# Patient Record
Sex: Male | Born: 1960 | Marital: Married | Smoking: Former smoker
Health system: Southern US, Community
[De-identification: ages and names within clinical notes are randomized; demographics above are authoritative.]

## PROBLEM LIST (undated history)

## (undated) DIAGNOSIS — M1990 Unspecified osteoarthritis, unspecified site: Secondary | ICD-10-CM

## (undated) DIAGNOSIS — E559 Vitamin D deficiency, unspecified: Secondary | ICD-10-CM

## (undated) DIAGNOSIS — J45909 Unspecified asthma, uncomplicated: Secondary | ICD-10-CM

## (undated) DIAGNOSIS — F419 Anxiety disorder, unspecified: Secondary | ICD-10-CM

## (undated) DIAGNOSIS — S82201B Unspecified fracture of shaft of right tibia, initial encounter for open fracture type I or II: Secondary | ICD-10-CM

## (undated) DIAGNOSIS — S82401B Unspecified fracture of shaft of right fibula, initial encounter for open fracture type I or II: Secondary | ICD-10-CM

## (undated) DIAGNOSIS — E7800 Pure hypercholesterolemia, unspecified: Secondary | ICD-10-CM

## (undated) DIAGNOSIS — D649 Anemia, unspecified: Secondary | ICD-10-CM

## (undated) DIAGNOSIS — I10 Essential (primary) hypertension: Secondary | ICD-10-CM

## (undated) DIAGNOSIS — E119 Type 2 diabetes mellitus without complications: Secondary | ICD-10-CM

## (undated) HISTORY — PX: LAPAROSCOPIC APPENDECTOMY: SHX408

## (undated) HISTORY — PX: COLONOSCOPY: SHX174

## (undated) HISTORY — PX: APPENDECTOMY: SHX54

## (undated) HISTORY — PX: KNEE SURGERY: SHX244

## (undated) HISTORY — PX: KNEE ARTHROSCOPY: SUR90

---

## 2005-04-09 HISTORY — PX: ESOPHAGOGASTRODUODENOSCOPY: SHX1529

## 2014-12-10 HISTORY — PX: COLONOSCOPY: SHX174

## 2018-07-19 ENCOUNTER — Inpatient Hospital Stay (HOSPITAL_COMMUNITY)
Admission: EM | Admit: 2018-07-19 | Discharge: 2018-07-21 | DRG: 494 | Disposition: A | Discharge status: Home / Self Care | Payer: No Typology Code available for payment source | Attending: Orthopedic Surgery | Admitting: Orthopedic Surgery

## 2018-07-19 ENCOUNTER — Inpatient Hospital Stay (HOSPITAL_COMMUNITY): Payer: No Typology Code available for payment source

## 2018-07-19 ENCOUNTER — Encounter (HOSPITAL_COMMUNITY): Payer: Self-pay | Admitting: Emergency Medicine

## 2018-07-19 ENCOUNTER — Inpatient Hospital Stay (HOSPITAL_COMMUNITY): Payer: No Typology Code available for payment source | Admitting: Anesthesiology

## 2018-07-19 ENCOUNTER — Emergency Department (HOSPITAL_COMMUNITY): Payer: No Typology Code available for payment source

## 2018-07-19 ENCOUNTER — Encounter (HOSPITAL_COMMUNITY)
Admission: EM | Disposition: A | Discharge status: Home / Self Care | Payer: Self-pay | Source: Home / Self Care | Attending: Orthopedic Surgery

## 2018-07-19 DIAGNOSIS — Y9289 Other specified places as the place of occurrence of the external cause: Secondary | ICD-10-CM | POA: Diagnosis not present

## 2018-07-19 DIAGNOSIS — Y990 Civilian activity done for income or pay: Secondary | ICD-10-CM

## 2018-07-19 DIAGNOSIS — I10 Essential (primary) hypertension: Secondary | ICD-10-CM | POA: Diagnosis present

## 2018-07-19 DIAGNOSIS — M79661 Pain in right lower leg: Secondary | ICD-10-CM | POA: Diagnosis present

## 2018-07-19 DIAGNOSIS — Z79899 Other long term (current) drug therapy: Secondary | ICD-10-CM

## 2018-07-19 DIAGNOSIS — E669 Obesity, unspecified: Secondary | ICD-10-CM | POA: Diagnosis present

## 2018-07-19 DIAGNOSIS — S82401B Unspecified fracture of shaft of right fibula, initial encounter for open fracture type I or II: Secondary | ICD-10-CM

## 2018-07-19 DIAGNOSIS — S82261B Displaced segmental fracture of shaft of right tibia, initial encounter for open fracture type I or II: Principal | ICD-10-CM | POA: Diagnosis present

## 2018-07-19 DIAGNOSIS — S8991XA Unspecified injury of right lower leg, initial encounter: Secondary | ICD-10-CM

## 2018-07-19 DIAGNOSIS — E7800 Pure hypercholesterolemia, unspecified: Secondary | ICD-10-CM | POA: Diagnosis present

## 2018-07-19 DIAGNOSIS — E119 Type 2 diabetes mellitus without complications: Secondary | ICD-10-CM | POA: Diagnosis present

## 2018-07-19 DIAGNOSIS — S82202B Unspecified fracture of shaft of left tibia, initial encounter for open fracture type I or II: Secondary | ICD-10-CM

## 2018-07-19 DIAGNOSIS — Z6831 Body mass index (BMI) 31.0-31.9, adult: Secondary | ICD-10-CM | POA: Diagnosis not present

## 2018-07-19 DIAGNOSIS — S82402B Unspecified fracture of shaft of left fibula, initial encounter for open fracture type I or II: Secondary | ICD-10-CM

## 2018-07-19 DIAGNOSIS — Z7984 Long term (current) use of oral hypoglycemic drugs: Secondary | ICD-10-CM

## 2018-07-19 DIAGNOSIS — W3189XA Contact with other specified machinery, initial encounter: Secondary | ICD-10-CM

## 2018-07-19 DIAGNOSIS — S82201B Unspecified fracture of shaft of right tibia, initial encounter for open fracture type I or II: Secondary | ICD-10-CM | POA: Diagnosis present

## 2018-07-19 DIAGNOSIS — Z23 Encounter for immunization: Secondary | ICD-10-CM | POA: Diagnosis not present

## 2018-07-19 DIAGNOSIS — T1490XA Injury, unspecified, initial encounter: Secondary | ICD-10-CM

## 2018-07-19 HISTORY — DX: Essential (primary) hypertension: I10

## 2018-07-19 HISTORY — DX: Unspecified fracture of shaft of right tibia, initial encounter for open fracture type I or II: S82.201B

## 2018-07-19 HISTORY — DX: Unspecified fracture of shaft of right fibula, initial encounter for open fracture type I or II: S82.401B

## 2018-07-19 HISTORY — DX: Pure hypercholesterolemia, unspecified: E78.00

## 2018-07-19 HISTORY — DX: Unspecified fracture of shaft of right tibia, initial encounter for open fracture type I or II: S82.401B

## 2018-07-19 HISTORY — PX: TIBIA IM NAIL INSERTION: SHX2516

## 2018-07-19 HISTORY — DX: Type 2 diabetes mellitus without complications: E11.9

## 2018-07-19 LAB — CBC WITH DIFFERENTIAL/PLATELET
ABS IMMATURE GRANULOCYTES: 0 10*3/uL (ref 0.0–0.1)
BASOS ABS: 0 10*3/uL (ref 0.0–0.1)
Basophils Relative: 0 %
Eosinophils Absolute: 0 10*3/uL (ref 0.0–0.7)
Eosinophils Relative: 0 %
HCT: 38.8 % — ABNORMAL LOW (ref 39.0–52.0)
HEMOGLOBIN: 12.4 g/dL — AB (ref 13.0–17.0)
Immature Granulocytes: 0 %
LYMPHS PCT: 51 %
Lymphs Abs: 2.9 10*3/uL (ref 0.7–4.0)
MCH: 33.1 pg (ref 26.0–34.0)
MCHC: 32 g/dL (ref 30.0–36.0)
MCV: 103.5 fL — ABNORMAL HIGH (ref 78.0–100.0)
Monocytes Absolute: 0.7 10*3/uL (ref 0.1–1.0)
Monocytes Relative: 11 %
NEUTROS ABS: 2.2 10*3/uL (ref 1.7–7.7)
Neutrophils Relative %: 38 %
PLATELETS: 242 10*3/uL (ref 150–400)
RBC: 3.75 MIL/uL — AB (ref 4.22–5.81)
RDW: 14.8 % (ref 11.5–15.5)
WBC: 5.9 10*3/uL (ref 4.0–10.5)

## 2018-07-19 LAB — PROTIME-INR
INR: 0.96
PROTHROMBIN TIME: 12.7 s (ref 11.4–15.2)

## 2018-07-19 LAB — COMPREHENSIVE METABOLIC PANEL
ALBUMIN: 3.2 g/dL — AB (ref 3.5–5.0)
ALT: 24 U/L (ref 0–44)
AST: 21 U/L (ref 15–41)
Alkaline Phosphatase: 41 U/L (ref 38–126)
Anion gap: 11 (ref 5–15)
BILIRUBIN TOTAL: 0.5 mg/dL (ref 0.3–1.2)
BUN: 11 mg/dL (ref 6–20)
CO2: 25 mmol/L (ref 22–32)
Calcium: 8.8 mg/dL — ABNORMAL LOW (ref 8.9–10.3)
Chloride: 105 mmol/L (ref 98–111)
Creatinine, Ser: 0.93 mg/dL (ref 0.61–1.24)
Glucose, Bld: 111 mg/dL — ABNORMAL HIGH (ref 70–99)
POTASSIUM: 4.2 mmol/L (ref 3.5–5.1)
SODIUM: 141 mmol/L (ref 135–145)
TOTAL PROTEIN: 5.8 g/dL — AB (ref 6.5–8.1)

## 2018-07-19 LAB — GLUCOSE, CAPILLARY
Glucose-Capillary: 122 mg/dL — ABNORMAL HIGH (ref 70–99)
Glucose-Capillary: 101 mg/dL — ABNORMAL HIGH (ref 70–99)
Glucose-Capillary: 106 mg/dL — ABNORMAL HIGH (ref 70–99)

## 2018-07-19 SURGERY — INSERTION, INTRAMEDULLARY ROD, TIBIA
Anesthesia: General | Site: Leg Lower | Laterality: Right

## 2018-07-19 MED ORDER — FERROUS SULFATE 325 (65 FE) MG PO TABS
325.0000 mg | ORAL_TABLET | ORAL | Status: DC
  Administered 2018-07-20: 325 mg via ORAL
  Filled 2018-07-19 (×2): qty 1

## 2018-07-19 MED ORDER — OXYCODONE HCL 5 MG PO TABS: 5.0000 mg | ORAL_TABLET | ORAL | 0 refills | Status: DC | PRN

## 2018-07-19 MED ORDER — METHOCARBAMOL 500 MG PO TABS
500.0000 mg | ORAL_TABLET | Freq: Four times a day (QID) | ORAL | Status: DC | PRN
  Administered 2018-07-20 – 2018-07-21 (×4): 500 mg via ORAL
  Filled 2018-07-19 (×4): qty 1

## 2018-07-19 MED ORDER — HYDROMORPHONE HCL 1 MG/ML IJ SOLN
1.0000 mg | Freq: Once | INTRAMUSCULAR | Status: AC
  Administered 2018-07-19: 09:00:00 via INTRAVENOUS

## 2018-07-19 MED ORDER — BISACODYL 10 MG RE SUPP: 10.0000 mg | Freq: Every day | RECTAL | Status: DC | PRN

## 2018-07-19 MED ORDER — AMLODIPINE BESYLATE 10 MG PO TABS
10.0000 mg | ORAL_TABLET | Freq: Every day | ORAL | Status: DC
  Administered 2018-07-20 – 2018-07-21 (×2): 10 mg via ORAL
  Filled 2018-07-19 (×2): qty 1

## 2018-07-19 MED ORDER — BACLOFEN 10 MG PO TABS: 10.0000 mg | ORAL_TABLET | Freq: Three times a day (TID) | ORAL | 0 refills | Status: DC

## 2018-07-19 MED ORDER — CARVEDILOL 3.125 MG PO TABS
3.1250 mg | ORAL_TABLET | Freq: Two times a day (BID) | ORAL | Status: DC
  Administered 2018-07-20 – 2018-07-21 (×4): 3.125 mg via ORAL
  Filled 2018-07-19 (×4): qty 1

## 2018-07-19 MED ORDER — ONDANSETRON HCL 4 MG/2ML IJ SOLN: 4.0000 mg | Freq: Four times a day (QID) | INTRAMUSCULAR | Status: DC | PRN

## 2018-07-19 MED ORDER — TETANUS-DIPHTH-ACELL PERTUSSIS 5-2.5-18.5 LF-MCG/0.5 IM SUSP
0.5000 mL | Freq: Once | INTRAMUSCULAR | Status: AC
  Administered 2018-07-19: 0.5 mL via INTRAMUSCULAR

## 2018-07-19 MED ORDER — ONDANSETRON HCL 4 MG PO TABS: 4.0000 mg | ORAL_TABLET | Freq: Four times a day (QID) | ORAL | Status: DC | PRN

## 2018-07-19 MED ORDER — 0.9 % SODIUM CHLORIDE (POUR BTL) OPTIME
TOPICAL | Status: DC | PRN
  Administered 2018-07-19: 1000 mL

## 2018-07-19 MED ORDER — OXYCODONE HCL 5 MG PO TABS
5.0000 mg | ORAL_TABLET | ORAL | Status: DC | PRN
  Administered 2018-07-20 – 2018-07-21 (×7): 10 mg via ORAL
  Filled 2018-07-19 (×7): qty 2

## 2018-07-19 MED ORDER — ONDANSETRON HCL 4 MG/2ML IJ SOLN
INTRAMUSCULAR | Status: AC
  Filled 2018-07-19: qty 2

## 2018-07-19 MED ORDER — PIOGLITAZONE HCL 30 MG PO TABS
30.0000 mg | ORAL_TABLET | Freq: Every day | ORAL | Status: DC
  Administered 2018-07-20 – 2018-07-21 (×2): 30 mg via ORAL
  Filled 2018-07-19 (×2): qty 1

## 2018-07-19 MED ORDER — CEFAZOLIN SODIUM-DEXTROSE 2-4 GM/100ML-% IV SOLN
2.0000 g | INTRAVENOUS | Status: AC
  Administered 2018-07-19: 2 g via INTRAVENOUS
  Filled 2018-07-19: qty 100

## 2018-07-19 MED ORDER — SENNA 8.6 MG PO TABS
1.0000 | ORAL_TABLET | Freq: Two times a day (BID) | ORAL | Status: DC
  Administered 2018-07-20 – 2018-07-21 (×3): 8.6 mg via ORAL
  Filled 2018-07-19 (×3): qty 1

## 2018-07-19 MED ORDER — LACTATED RINGERS IV SOLN
INTRAVENOUS | Status: DC
  Administered 2018-07-19 (×2): via INTRAVENOUS

## 2018-07-19 MED ORDER — FENTANYL CITRATE (PF) 100 MCG/2ML IJ SOLN
25.0000 ug | INTRAMUSCULAR | Status: DC | PRN
  Administered 2018-07-19 (×2): 50 ug via INTRAVENOUS

## 2018-07-19 MED ORDER — MUSCLE RUB 10-15 % EX CREA
1.0000 "application " | TOPICAL_CREAM | CUTANEOUS | Status: DC | PRN
  Administered 2018-07-20: 1 via TOPICAL
  Filled 2018-07-19: qty 85

## 2018-07-19 MED ORDER — ACETAMINOPHEN 325 MG PO TABS: 650.0000 mg | ORAL_TABLET | Freq: Four times a day (QID) | ORAL | Status: DC | PRN

## 2018-07-19 MED ORDER — ONDANSETRON HCL 4 MG/2ML IJ SOLN
INTRAMUSCULAR | Status: DC | PRN
  Administered 2018-07-19: 4 mg via INTRAVENOUS

## 2018-07-19 MED ORDER — METHOCARBAMOL 500 MG PO TABS: 500.0000 mg | ORAL_TABLET | Freq: Four times a day (QID) | ORAL | Status: DC | PRN

## 2018-07-19 MED ORDER — SODIUM CHLORIDE 0.9 % IR SOLN
Status: DC | PRN
  Administered 2018-07-19: 9000 mL

## 2018-07-19 MED ORDER — METHOCARBAMOL 1000 MG/10ML IJ SOLN
500.0000 mg | Freq: Four times a day (QID) | INTRAVENOUS | Status: DC | PRN
  Filled 2018-07-19: qty 5

## 2018-07-19 MED ORDER — ENOXAPARIN SODIUM 40 MG/0.4ML ~~LOC~~ SOLN
40.0000 mg | SUBCUTANEOUS | Status: DC
  Administered 2018-07-20 – 2018-07-21 (×2): 40 mg via SUBCUTANEOUS
  Filled 2018-07-19 (×2): qty 0.4

## 2018-07-19 MED ORDER — PHENTERMINE HCL 37.5 MG PO TABS: 37.5000 mg | ORAL_TABLET | Freq: Every day | ORAL | Status: DC

## 2018-07-19 MED ORDER — ACETAMINOPHEN 650 MG RE SUPP: 650.0000 mg | Freq: Four times a day (QID) | RECTAL | Status: DC | PRN

## 2018-07-19 MED ORDER — SUGAMMADEX SODIUM 200 MG/2ML IV SOLN
INTRAVENOUS | Status: DC | PRN
  Administered 2018-07-19: 200 mg via INTRAVENOUS

## 2018-07-19 MED ORDER — POTASSIUM CHLORIDE IN NACL 20-0.45 MEQ/L-% IV SOLN
INTRAVENOUS | Status: DC
  Administered 2018-07-20: 01:00:00 via INTRAVENOUS
  Filled 2018-07-19 (×2): qty 1000

## 2018-07-19 MED ORDER — GLYCOPYRROLATE PF 0.2 MG/ML IJ SOSY
PREFILLED_SYRINGE | INTRAMUSCULAR | Status: AC
  Filled 2018-07-19: qty 1

## 2018-07-19 MED ORDER — TETANUS-DIPHTH-ACELL PERTUSSIS 5-2.5-18.5 LF-MCG/0.5 IM SUSP
INTRAMUSCULAR | Status: AC
  Administered 2018-07-19: 0.5 mL via INTRAMUSCULAR
  Filled 2018-07-19: qty 0.5

## 2018-07-19 MED ORDER — FENTANYL CITRATE (PF) 250 MCG/5ML IJ SOLN
INTRAMUSCULAR | Status: AC
  Filled 2018-07-19: qty 5

## 2018-07-19 MED ORDER — CEFAZOLIN SODIUM-DEXTROSE 2-4 GM/100ML-% IV SOLN
2.0000 g | Freq: Three times a day (TID) | INTRAVENOUS | Status: DC
  Administered 2018-07-19 – 2018-07-21 (×5): 2 g via INTRAVENOUS
  Filled 2018-07-19 (×7): qty 100

## 2018-07-19 MED ORDER — HYDROMORPHONE HCL 1 MG/ML IJ SOLN
1.0000 mg | INTRAMUSCULAR | Status: DC | PRN
  Administered 2018-07-21: 1 mg via INTRAVENOUS
  Filled 2018-07-19 (×2): qty 1

## 2018-07-19 MED ORDER — DIPHENHYDRAMINE HCL 12.5 MG/5ML PO ELIX: 12.5000 mg | ORAL_SOLUTION | ORAL | Status: DC | PRN

## 2018-07-19 MED ORDER — MIDAZOLAM HCL 2 MG/2ML IJ SOLN
INTRAMUSCULAR | Status: AC
  Filled 2018-07-19: qty 2

## 2018-07-19 MED ORDER — METFORMIN HCL 500 MG PO TABS
1000.0000 mg | ORAL_TABLET | Freq: Two times a day (BID) | ORAL | Status: DC
  Administered 2018-07-20 – 2018-07-21 (×3): 1000 mg via ORAL
  Filled 2018-07-19 (×3): qty 2

## 2018-07-19 MED ORDER — PROPOFOL 10 MG/ML IV BOLUS
INTRAVENOUS | Status: AC
  Filled 2018-07-19: qty 20

## 2018-07-19 MED ORDER — FENTANYL CITRATE (PF) 100 MCG/2ML IJ SOLN
INTRAMUSCULAR | Status: DC | PRN
  Administered 2018-07-19 (×3): 50 ug via INTRAVENOUS

## 2018-07-19 MED ORDER — MAGNESIUM CITRATE PO SOLN: 1.0000 | Freq: Once | ORAL | Status: DC | PRN

## 2018-07-19 MED ORDER — DEXAMETHASONE SODIUM PHOSPHATE 10 MG/ML IJ SOLN
INTRAMUSCULAR | Status: AC
  Filled 2018-07-19: qty 1

## 2018-07-19 MED ORDER — ACETAMINOPHEN 500 MG PO TABS
1000.0000 mg | ORAL_TABLET | Freq: Four times a day (QID) | ORAL | Status: DC
  Administered 2018-07-20 – 2018-07-21 (×5): 1000 mg via ORAL
  Filled 2018-07-19 (×6): qty 2

## 2018-07-19 MED ORDER — ROCURONIUM BROMIDE 10 MG/ML (PF) SYRINGE
PREFILLED_SYRINGE | INTRAVENOUS | Status: DC | PRN
  Administered 2018-07-19: 50 mg via INTRAVENOUS

## 2018-07-19 MED ORDER — SIMVASTATIN 40 MG PO TABS: 40.0000 mg | ORAL_TABLET | Freq: Every evening | ORAL | Status: DC

## 2018-07-19 MED ORDER — HYDROMORPHONE HCL 1 MG/ML IJ SOLN
INTRAMUSCULAR | Status: AC
  Filled 2018-07-19: qty 1

## 2018-07-19 MED ORDER — ENOXAPARIN SODIUM 40 MG/0.4ML ~~LOC~~ SOLN: 40.0000 mg | SUBCUTANEOUS | Status: DC

## 2018-07-19 MED ORDER — DOCUSATE SODIUM 100 MG PO CAPS
100.0000 mg | ORAL_CAPSULE | Freq: Two times a day (BID) | ORAL | Status: DC
  Administered 2018-07-19 – 2018-07-21 (×4): 100 mg via ORAL
  Filled 2018-07-19 (×3): qty 1

## 2018-07-19 MED ORDER — OXYCODONE HCL 5 MG PO TABS
5.0000 mg | ORAL_TABLET | ORAL | Status: DC | PRN
  Administered 2018-07-19: 15 mg via ORAL
  Filled 2018-07-19: qty 3

## 2018-07-19 MED ORDER — ONDANSETRON HCL 4 MG PO TABS: 4.0000 mg | ORAL_TABLET | Freq: Three times a day (TID) | ORAL | 0 refills | Status: DC | PRN

## 2018-07-19 MED ORDER — EPHEDRINE 5 MG/ML INJ
INTRAVENOUS | Status: AC
  Filled 2018-07-19: qty 10

## 2018-07-19 MED ORDER — MIDAZOLAM HCL 2 MG/2ML IJ SOLN
INTRAMUSCULAR | Status: DC | PRN
  Administered 2018-07-19: 2 mg via INTRAVENOUS

## 2018-07-19 MED ORDER — ONDANSETRON HCL 4 MG/2ML IJ SOLN: 4.0000 mg | Freq: Once | INTRAMUSCULAR | Status: DC | PRN

## 2018-07-19 MED ORDER — DEXAMETHASONE SODIUM PHOSPHATE 10 MG/ML IJ SOLN
INTRAMUSCULAR | Status: DC | PRN
  Administered 2018-07-19: 10 mg via INTRAVENOUS

## 2018-07-19 MED ORDER — CANAGLIFLOZIN 100 MG PO TABS
100.0000 mg | ORAL_TABLET | Freq: Every day | ORAL | Status: DC
  Administered 2018-07-20 – 2018-07-21 (×2): 100 mg via ORAL
  Filled 2018-07-19 (×2): qty 1

## 2018-07-19 MED ORDER — HYDROMORPHONE HCL 1 MG/ML IJ SOLN: 1.0000 mg | INTRAMUSCULAR | Status: DC | PRN

## 2018-07-19 MED ORDER — CITALOPRAM HYDROBROMIDE 40 MG PO TABS
40.0000 mg | ORAL_TABLET | Freq: Every day | ORAL | Status: DC
  Administered 2018-07-20 – 2018-07-21 (×2): 40 mg via ORAL
  Filled 2018-07-19 (×2): qty 1

## 2018-07-19 MED ORDER — POLYETHYLENE GLYCOL 3350 17 G PO PACK: 17.0000 g | PACK | Freq: Every day | ORAL | Status: DC | PRN

## 2018-07-19 MED ORDER — SENNA-DOCUSATE SODIUM 8.6-50 MG PO TABS: 2.0000 | ORAL_TABLET | Freq: Every day | ORAL | 1 refills | Status: DC

## 2018-07-19 MED ORDER — DOXYCYCLINE HYCLATE 50 MG PO CAPS: 50.0000 mg | ORAL_CAPSULE | Freq: Two times a day (BID) | ORAL | 0 refills | Status: DC

## 2018-07-19 MED ORDER — LIDOCAINE 2% (20 MG/ML) 5 ML SYRINGE
INTRAMUSCULAR | Status: DC | PRN
  Administered 2018-07-19: 40 mg via INTRAVENOUS

## 2018-07-19 MED ORDER — CEFAZOLIN SODIUM-DEXTROSE 1-4 GM/50ML-% IV SOLN: 1.0000 g | Freq: Once | INTRAVENOUS | Status: DC

## 2018-07-19 MED ORDER — CEFAZOLIN SODIUM-DEXTROSE 2-4 GM/100ML-% IV SOLN
INTRAVENOUS | Status: AC
  Administered 2018-07-19: 1 g
  Filled 2018-07-19: qty 100

## 2018-07-19 MED ORDER — PROPOFOL 10 MG/ML IV BOLUS
INTRAVENOUS | Status: DC | PRN
  Administered 2018-07-19: 200 mg via INTRAVENOUS

## 2018-07-19 MED ORDER — FENTANYL CITRATE (PF) 100 MCG/2ML IJ SOLN
INTRAMUSCULAR | Status: AC
  Filled 2018-07-19: qty 2

## 2018-07-19 MED ORDER — ZOLPIDEM TARTRATE 5 MG PO TABS
5.0000 mg | ORAL_TABLET | Freq: Every evening | ORAL | Status: DC | PRN
  Filled 2018-07-19: qty 1

## 2018-07-19 MED ORDER — ROCURONIUM BROMIDE 50 MG/5ML IV SOSY
PREFILLED_SYRINGE | INTRAVENOUS | Status: AC
  Filled 2018-07-19: qty 5

## 2018-07-19 MED ORDER — LIDOCAINE 2% (20 MG/ML) 5 ML SYRINGE
INTRAMUSCULAR | Status: AC
  Filled 2018-07-19: qty 5

## 2018-07-19 MED ORDER — DICLOFENAC SODIUM 25 MG PO TBEC
25.0000 mg | DELAYED_RELEASE_TABLET | Freq: Four times a day (QID) | ORAL | Status: DC
  Administered 2018-07-19 – 2018-07-21 (×7): 25 mg via ORAL
  Filled 2018-07-19 (×10): qty 1

## 2018-07-19 SURGICAL SUPPLY — 78 items
BANDAGE ACE 4X5 VEL STRL LF (GAUZE/BANDAGES/DRESSINGS) ×6 IMPLANT
BANDAGE ACE 6X5 VEL STRL LF (GAUZE/BANDAGES/DRESSINGS) ×3 IMPLANT
BANDAGE ESMARK 6X9 LF (GAUZE/BANDAGES/DRESSINGS) IMPLANT
BIT DRILL 2.5X2.75 QC CALB (BIT) ×3 IMPLANT
BIT DRILL CALIBRATED 4.3X320MM (BIT) ×1 IMPLANT
BIT DRILL CROWE POINT TWST 4.3 (DRILL) ×1 IMPLANT
BLADE CLIPPER SURG (BLADE) IMPLANT
BLADE SURG 15 STRL LF DISP TIS (BLADE) ×1 IMPLANT
BLADE SURG 15 STRL SS (BLADE) ×2
BNDG COHESIVE 6X5 TAN STRL LF (GAUZE/BANDAGES/DRESSINGS) ×3 IMPLANT
BNDG ESMARK 6X9 LF (GAUZE/BANDAGES/DRESSINGS)
BNDG GAUZE ELAST 4 BULKY (GAUZE/BANDAGES/DRESSINGS) ×3 IMPLANT
BOOTCOVER CLEANROOM LRG (PROTECTIVE WEAR) ×6 IMPLANT
CLOSURE STERI-STRIP 1/2X4 (GAUZE/BANDAGES/DRESSINGS) ×2
CLSR STERI-STRIP ANTIMIC 1/2X4 (GAUZE/BANDAGES/DRESSINGS) ×4 IMPLANT
COVER SURGICAL LIGHT HANDLE (MISCELLANEOUS) ×6 IMPLANT
CUFF TOURNIQUET SINGLE 34IN LL (TOURNIQUET CUFF) IMPLANT
CUFF TOURNIQUET SINGLE 44IN (TOURNIQUET CUFF) IMPLANT
DRAPE C-ARM 42X72 X-RAY (DRAPES) ×3 IMPLANT
DRAPE C-ARMOR (DRAPES) ×3 IMPLANT
DRAPE HALF SHEET 40X57 (DRAPES) ×6 IMPLANT
DRAPE IMP U-DRAPE 54X76 (DRAPES) ×3 IMPLANT
DRAPE ORTHO SPLIT 77X108 STRL (DRAPES) ×6
DRAPE SURG ORHT 6 SPLT 77X108 (DRAPES) ×3 IMPLANT
DRAPE U-SHAPE 47X51 STRL (DRAPES) ×3 IMPLANT
DRILL CALIBRATED 4.3X320MM (BIT) ×3
DRILL CROWE POINT TWIST 4.3 (DRILL) ×3
DRSG ADAPTIC 3X8 NADH LF (GAUZE/BANDAGES/DRESSINGS) ×3 IMPLANT
DRSG MEPILEX BORDER 4X4 (GAUZE/BANDAGES/DRESSINGS) ×9 IMPLANT
DRSG MEPILEX BORDER 4X8 (GAUZE/BANDAGES/DRESSINGS) ×3 IMPLANT
DURAPREP 26ML APPLICATOR (WOUND CARE) ×3 IMPLANT
ELECT REM PT RETURN 9FT ADLT (ELECTROSURGICAL) ×3
ELECTRODE REM PT RTRN 9FT ADLT (ELECTROSURGICAL) ×1 IMPLANT
FACESHIELD WRAPAROUND (MASK) IMPLANT
GAUZE SPONGE 4X4 12PLY STRL (GAUZE/BANDAGES/DRESSINGS) ×6 IMPLANT
GLOVE BIOGEL PI ORTHO PRO SZ8 (GLOVE) ×2
GLOVE ORTHO TXT STRL SZ7.5 (GLOVE) ×6 IMPLANT
GLOVE PI ORTHO PRO STRL SZ8 (GLOVE) ×1 IMPLANT
GLOVE SURG ORTHO 8.0 STRL STRW (GLOVE) ×6 IMPLANT
GOWN STRL REUS W/ TWL LRG LVL3 (GOWN DISPOSABLE) IMPLANT
GOWN STRL REUS W/TWL LRG LVL3 (GOWN DISPOSABLE)
GUIDE PIN 3.2MM (PIN) ×3 IMPLANT
GUIDEPIN 3.2X17.5 THRD DISP (PIN) ×3 IMPLANT
GUIDEWIRE 2.6X80 BEAD TIP (WIRE) ×1 IMPLANT
GUIDWIRE 2.6X80 BEAD TIP (WIRE) ×3
KIT BASIN OR (CUSTOM PROCEDURE TRAY) ×3 IMPLANT
KIT TURNOVER KIT B (KITS) ×3 IMPLANT
MANIFOLD NEPTUNE II (INSTRUMENTS) ×3 IMPLANT
NAIL TIBIAL PHOENIX 9.0X370MM (Nail) ×3 IMPLANT
NS IRRIG 1000ML POUR BTL (IV SOLUTION) ×3 IMPLANT
PACK GENERAL/GYN (CUSTOM PROCEDURE TRAY) ×3 IMPLANT
PACK UNIVERSAL I (CUSTOM PROCEDURE TRAY) ×3 IMPLANT
PAD ARMBOARD 7.5X6 YLW CONV (MISCELLANEOUS) ×6 IMPLANT
PAD CAST 4YDX4 CTTN HI CHSV (CAST SUPPLIES) ×2 IMPLANT
PADDING CAST COTTON 4X4 STRL (CAST SUPPLIES) ×4
PLATE TUB 100DEG 5 HO (Plate) ×3 IMPLANT
SCREW CORT TI DBL LEAD 5X34 (Screw) ×3 IMPLANT
SCREW CORT TI DBL LEAD 5X40 (Screw) ×3 IMPLANT
SCREW CORT TI DBL LEAD 5X60 (Screw) ×3 IMPLANT
SCREW CORT TI DBL LEAD 5X65 (Screw) ×3 IMPLANT
SCREW CORTICAL 3.5MM  10MM (Screw) ×8 IMPLANT
SCREW CORTICAL 3.5MM 10MM (Screw) ×4 IMPLANT
SET CYSTO W/LG BORE CLAMP LF (SET/KITS/TRAYS/PACK) ×3 IMPLANT
SPLINT PLASTER CAST XFAST 5X30 (CAST SUPPLIES) ×1 IMPLANT
SPLINT PLASTER XFAST SET 5X30 (CAST SUPPLIES) ×2
STAPLER VISISTAT 35W (STAPLE) ×3 IMPLANT
STOCKINETTE IMPERVIOUS LG (DRAPES) ×6 IMPLANT
SUCTION FRAZIER HANDLE 10FR (MISCELLANEOUS) ×4
SUCTION TUBE FRAZIER 10FR DISP (MISCELLANEOUS) ×2 IMPLANT
SUT VIC AB 0 CT1 18XCR BRD 8 (SUTURE) ×1 IMPLANT
SUT VIC AB 0 CT1 8-18 (SUTURE) ×2
SUT VIC AB 2-0 CT1 27 (SUTURE) ×2
SUT VIC AB 2-0 CT1 TAPERPNT 27 (SUTURE) ×1 IMPLANT
SUT VIC AB 3-0 SH 8-18 (SUTURE) ×3 IMPLANT
TOWEL OR 17X24 6PK STRL BLUE (TOWEL DISPOSABLE) ×3 IMPLANT
TOWEL OR 17X26 10 PK STRL BLUE (TOWEL DISPOSABLE) ×3 IMPLANT
TRAY FOLEY MTR SLVR 16FR STAT (SET/KITS/TRAYS/PACK) IMPLANT
WATER STERILE IRR 1000ML POUR (IV SOLUTION) ×3 IMPLANT

## 2018-07-19 NOTE — ED Notes (Signed)
[facility]

## 2018-07-19 NOTE — ED Notes (Signed)
5N unable to take reporting. Going up with pt to do bedside report.

## 2018-07-19 NOTE — H&P (Addendum)
Duane Collins is an 57 y.o. male.   Chief Complaint: Right tib/fib fx  HPI: Duane Collins was at work and got run over by a forklift. He's unsure exactly how it happened. He had significant leg pain and came to the ED for evaluation. X-rays showed a right tib/fib fx and orthopedic surgery was consulted.  Pain better with rest, as well as IV pain medication, worse with movement.  He denies any other injuries.  Injury occurred earlier today.  PMHX: DM, HTN  No family history on file. Social History: Denies nicotine  Allergies: Allergies not on file   Review of Systems  Constitutional: Negative for weight loss.  HENT: Negative for ear discharge, ear pain, hearing loss and tinnitus.   Eyes: Negative for blurred vision, double vision, photophobia and pain.  Respiratory: Negative for cough, sputum production and shortness of breath.   Cardiovascular: Negative for chest pain.  Gastrointestinal: Negative for abdominal pain, nausea and vomiting.  Genitourinary: Negative for dysuria, flank pain, frequency and urgency.  Musculoskeletal: Positive for joint pain (Right lower leg). Negative for back pain, falls, myalgias and neck pain.  Neurological: Negative for dizziness, tingling, sensory change, focal weakness, loss of consciousness and headaches.  Endo/Heme/Allergies: Does not bruise/bleed easily.  Psychiatric/Behavioral: Negative for depression, memory loss and substance abuse. The patient is not nervous/anxious.     There were no vitals taken for this visit. Physical Exam  Constitutional: He appears well-developed and well-nourished. No distress.  HENT:  Head: Normocephalic and atraumatic.  Eyes: Conjunctivae are normal. Right eye exhibits no discharge. Left eye exhibits no discharge. No scleral icterus.  Neck: Normal range of motion.  Cardiovascular: Normal rate and regular rhythm.  Respiratory: Effort normal. No respiratory distress.  Musculoskeletal:  RLE Punctate wound medial lower leg,  no ecchymosis or rash  TTP, compartments soft  No knee or ankle effusion  Sens DPN, SPN, TN intact  Motor EHL, ext, flex, evers 5/5  DP 2+, PT 1+, No significant edema  Neurological: He is alert.  Skin: Skin is warm and dry. He is not diaphoretic.  Psychiatric: He has a normal mood and affect. His behavior is normal.     Assessment/Plan Forklift accident Right tib/fib fxs -- Plan IMN, date and surgeon TBD. NPO for now. DM HTN    Freeman CaldronMichael J. Jeffery, PA-C Orthopedic Surgery [phone] 07/19/2018, 9:18 AM   Patient seen and agree with above.  He has been given IV Ancef, concern for grade 1 open fracture.  We have been awaiting availability for operative room services, and are now able to proceed with urgent irrigation and debridement, intramedullary nail fixation, and I will also likely use a small plate for fixation on the proximal third tibia.  This is a complicated segmental fracture, that has risk for malunion, nonunion, infection, osteomyelitis, among others.  The risks benefits and alternatives were discussed with the patient including but not limited to the risks of nonoperative treatment, versus surgical intervention including infection, bleeding, nerve injury, malunion, nonunion, the need for revision surgery, hardware prominence, hardware failure, the need for hardware removal, blood clots, cardiopulmonary complications, morbidity, mortality, among others, and they were willing to proceed.    5:05 PM

## 2018-07-19 NOTE — Op Note (Signed)
07/19/2018  8:14 PM  PATIENT:  Duane Collins    PRE-OPERATIVE DIAGNOSIS: Grade 1 open right segmental tibia fracture with a proximal third metaphyseal fracture and a distal third diaphyseal fracture  POST-OPERATIVE DIAGNOSIS:  Same  PROCEDURE:    1.  Incision, irrigation, debridement, open grade 1 right distal tibial diaphyseal fracture 2.  Open plating, right proximal third tibia fracture 3.  Intramedullary nail fixation, right proximal third and distal third tibia fracture  SURGEON:  Eulas Post, MD  PHYSICIAN ASSISTANT: Janace Litten, OPA-C, present and scrubbed throughout the case, critical for completion in a timely fashion, and for retraction, instrumentation, and closure.  ANESTHESIA:   General  PREOPERATIVE INDICATIONS:  Duane Collins is a  57 y.o. male who was run over by a forklift today and had an open right tibia fracture that was segmental in nature.  His compartments were soft and neurovascularly intact distally.  He elected for urgent surgical management.  The risks benefits and alternatives were discussed with the patient including but not limited to the risks of nonoperative treatment, versus surgical intervention including infection, bleeding, nerve injury, malunion, nonunion, the need for revision surgery, hardware prominence, hardware failure, the need for hardware removal, blood clots, cardiopulmonary complications, morbidity, mortality, among others, and they were willing to proceed.    ESTIMATED BLOOD LOSS:  OPERATIVE IMPLANTS: Biomet [place] tibial nail size 9 with a total of 2 proximal interlocking bolts, and 2 distal interlocking bolts.  The locking sleeve was engaged proximally, to achieve a fixed angle device, and I also used a 5 hole one third tubular plate with 2 proximal and 2 distal screws for fixation of the proximal fracture to allow appropriate positioning and instrumentation of the nail.  OPERATIVE FINDINGS: Comminuted proximal and  distal fracture.  OPERATIVE PROCEDURE: The patient was brought to the operating room and placed in the supine position. Gen. anesthesia was administered. The lower extremity was prepped and draped in usual sterile fashion. Time out was performed.  I localized the open fracture, it was basically a pinhole, but there was significant soft tissue destruction below.  I extended the pinhole about 1.5 cm on either side, which allowed me to deliver the fragment of bone through the wound using a bone hook, as well as a Cobb.  This was more of an incisional debridement, I did not really excise any tissue although there was a small fragment of bone which I did excise.  This was avascular.  I irrigated a total of 9 L across the wound.  A scalpel, dissection scissors, and Cobb was utilized for the debridement.  After complete debridement had been carried out, I changed gloves, placed a new drape, passed off the instruments and suction, and proceeded with the remainder of the case.  I localized the distal medial aspect of the proximal tibial fracture using a C arm, and then made a longitudinal incision large enough to accommodate placement of a 5 hole plate.  There was some comminution in this proximal segment, as well as a butterfly piece, which I was able to basically work around.  I achieved as close to anatomic fixation as possible, and then secured the plate proximally and distally with size 10 unicortical screws.  Once I had fixation proximally, I proceeded with the tibial nail portion of the case.  Anterior patellar tendon incision was performed and the proximal tibia exposed. The knee was hyperflexed, and a guidewire introduced into the appropriate position and confirmed on fluoroscopy  on both AP and lateral views.  I opened the proximal tibia with the appropriate reamer, and then placed a ball-tipped guidewire down across the fracture site reducing it anatomically.  I confirmed position on AP and lateral  views across the entire length of the tibia, and then reamed sequentially to 1.5 mm above the nail size.  I had significant chatter at the size 10, I am not sure if I was engaging on any of the unicortical screws, or if it was the cortex, but nonetheless I selected to use a size 9 nail given the significant interference.  The nail was measured in length, selected, opened, assembled, and then delivered down the tibia across the fracture site. Appropriate alignment confirmed on AP and lateral views. The length was also confirmed.  I then secured the nail with proximal interlocking bolts, and also used perfect circles technique to secure the nail distally with interlocking bolts. I took care to confirm fracture apposition as well as rotational alignment clinically and radiographically prior to securing the distal segment.  The proximal most screw from the distal cluster engaged somewhat on the nail, but I was able to pass it, but if it ever needs to be removed, caution should be made to not strip the screw.  I did not have any missed turns, but it does have an interference fit with the nail.  Final C-arm pictures were taken, the wounds were irrigated copiously, there were also injected with Marcaine, and the patellar tendon split repaired with Vicryl followed by Vicryl and Monocryl for the subcutaneous tissues. Steri-Strips and sterile gauze was applied followed by a posterior splint. The patient was awakened and returned to the PACU in stable and satisfactory condition. There were no complications.

## 2018-07-19 NOTE — ED Triage Notes (Signed)
BIB EMS from work, pt was involved in forklift accident. Forklift ran over pt R leg resulting in deformity. No other complaints. VSS. Given Fentanyl en route.

## 2018-07-19 NOTE — Care Management Note (Signed)
Case Management Note  Patient Details  Name: Duane Collins MRN: [id number] Date of Birth: [birth year]  Subjective/Objective:  Closed Fracture of right tibia                 Action/Plan: Pt lives at home with spouse; Injured at work, Workers Comp; CM talked to patient, he does not have all of his contact information with him at this time. CM to follow up.   Expected Discharge Date:   possibly 07/23/2018               Expected Discharge Plan:   Home  Status of Service:   In progress  Reola MosherChandler, Vonya Ohalloran L, RN,MHA,BSN  [phone] 07/19/2018, 12:38 PM

## 2018-07-19 NOTE — Transfer of Care (Signed)
Immediate Anesthesia Transfer of Care Note  Patient: Duane PiliDennis R Tapia  Procedure(s) Performed: INTRAMEDULLARY (IM) NAIL TIBIAL (Right Leg Lower)  Patient Location: PACU  Anesthesia Type:General  Level of Consciousness: awake, oriented and sedated  Airway & Oxygen Therapy: Patient connected to nasal cannula oxygen  Post-op Assessment: Report given to RN and Post -op Vital signs reviewed and stable  Post vital signs: Reviewed and stable  Last Vitals:  Vitals Value Taken Time  BP 138/70 07/19/2018  8:54 PM  Temp    Pulse 67 07/19/2018  8:56 PM  Resp 16 07/19/2018  8:56 PM  SpO2 100 % 07/19/2018  8:56 PM  Vitals shown include unvalidated device data.  Last Pain:  Vitals:   07/19/18 1231  TempSrc:   PainSc: Asleep         Complications: No apparent anesthesia complications

## 2018-07-19 NOTE — Discharge Instructions (Signed)

## 2018-07-19 NOTE — ED Provider Notes (Signed)
[facility] EMERGENCY DEPARTMENT Provider Note   CSN: [id number] Arrival date & time: 07/19/18  0855     History   Chief Complaint Chief Complaint  Patient presents with  . Leg Injury    HPI Duane Collins is a 57 y.o. male.  HPI  57 year old male with a history of hypertension, diabetes and borderline cholesterol presents with right leg injury.  Patient was run over by a forklift on his right leg.  He did not see the injury but immediately felt pain.  Been having severe right leg pain since.  EMS reports closed leg injury and has been given 300 mcg IV fentanyl.  No weakness or numbness.  No other injuries including no headache, chest pain, abdominal pain. No pain in knee or ankle.  Past Medical History:  Diagnosis Date  . Diabetes mellitus without complication (HCC)   . High cholesterol   . Hypertension     Patient Active Problem List   Diagnosis Date Noted  . Closed fracture of right tibia 07/19/2018    History reviewed. No pertinent surgical history.      Home Medications    Prior to Admission medications   Medication Sig Start Date End Date Taking? Authorizing Provider  amLODipine (NORVASC) 10 MG tablet Take 10 mg by mouth daily. 04/29/18  Yes [provider]  Camphor-Menthol-Methyl Sal (MUSCLE RUB ULTRA STRENGTH) 02-09-29 % CREA Apply 1 application topically as needed (muscle cramps).   Yes [provider]  carvedilol (COREG) 3.125 MG tablet Take 3.125 mg by mouth 2 (two) times daily. 05/26/18  Yes [provider]  citalopram (CELEXA) 40 MG tablet Take 40 mg by mouth daily.   Yes [provider]  dapagliflozin propanediol (FARXIGA) 10 MG TABS tablet Take 10 mg by mouth daily.   Yes [provider]  ferrous sulfate 325 (65 FE) MG tablet Take 325 mg by mouth once a week. 05/26/18  Yes [provider]  metFORMIN (GLUCOPHAGE) 1000 MG tablet Take 1,000 mg by mouth 2 (two) times daily with a meal.  05/12/18  Yes [provider]  phentermine (ADIPEX-P) 37.5 MG tablet Take 37.5 mg by mouth daily. 05/14/18  Yes [provider]  pioglitazone (ACTOS) 30 MG tablet Take 30 mg by mouth daily. 05/12/18  Yes [provider]  simvastatin (ZOCOR) 40 MG tablet Take 40 mg by mouth every evening. 04/21/18  Yes [provider]  Vitamin D, Ergocalciferol, (DRISDOL) 50000 units CAPS capsule Take 50,000 Units by mouth once a week. 05/09/18  Yes [provider]  ZIPSOR 25 MG CAPS Take 25 mg by mouth 4 (four) times daily. 06/08/18  Yes [provider]    Family History No family history on file.  Social History Social History   Tobacco Use  . Smoking status: Never Smoker  . Smokeless tobacco: Never Used  Substance Use Topics  . Alcohol use: Not Currently  . Drug use: Not Currently     Allergies   Patient has no known allergies.   Review of Systems Review of Systems  Respiratory: Negative for shortness of breath.   Cardiovascular: Negative for chest pain.  Gastrointestinal: Negative for abdominal pain.  Musculoskeletal: Positive for arthralgias.  Neurological: Negative for weakness and numbness.  All other systems reviewed and are negative.    Physical Exam Updated Vital Signs BP 129/75 (BP Location: Left Arm)   Pulse (!) 58   Temp 98.2 F (36.8 C) (Oral)   Resp 20  Ht 5\' 8"  (1.727 m)   Wt 95.3 kg   SpO2 97%   BMI 31.93 kg/m   Physical Exam  Constitutional: He is oriented to person, place, and time. He appears well-developed and well-nourished. He appears distressed (in severe pain).  obese  HENT:  Head: Normocephalic and atraumatic.  Right Ear: External ear normal.  Left Ear: External ear normal.  Nose: Nose normal.  Eyes: Right eye exhibits no discharge. Left eye exhibits no discharge.  Neck: Neck supple.  Cardiovascular: Normal rate, regular rhythm and normal heart sounds.  Pulses:      Dorsalis pedis pulses are 2+ on  the right side, and 2+ on the left side.  Pulmonary/Chest: Effort normal and breath sounds normal.  Abdominal: Soft. There is no tenderness.  Musculoskeletal: He exhibits no edema.       Right knee: He exhibits no swelling. No tenderness found.       Right ankle: He exhibits no swelling. No tenderness.       Right lower leg: He exhibits tenderness, deformity and laceration.       Legs:      Right foot: There is no tenderness and no swelling.  Puncture wound to medial mid lower leg. Oozing blood Normal sensation and movement of foot  Neurological: He is alert and oriented to person, place, and time.  Skin: Skin is warm and dry. He is not diaphoretic.  Nursing note and vitals reviewed.    ED Treatments / Results  Labs (all labs ordered are listed, but only abnormal results are displayed) Labs Reviewed  CBC WITH DIFFERENTIAL/PLATELET - Abnormal; Notable for the following components:      Result Value   RBC 3.75 (*)    Hemoglobin 12.4 (*)    HCT 38.8 (*)    MCV 103.5 (*)    All other components within normal limits  COMPREHENSIVE METABOLIC PANEL - Abnormal; Notable for the following components:   Glucose, Bld 111 (*)    Calcium 8.8 (*)    Total Protein 5.8 (*)    Albumin 3.2 (*)    All other components within normal limits  PROTIME-INR    EKG None  Radiology Dg Chest Portable 1 View  Result Date: 07/19/2018 CLINICAL DATA:  Preoperative examination prior to ORIF for right tibia and fibular fracture. Former smoker. No current chest complaints. EXAM: PORTABLE CHEST 1 VIEW COMPARISON:  Chest x-ray of May 25, 2018 FINDINGS: The lungs are adequately inflated and clear. The cardiac silhouette remains enlarged. The central pulmonary vascularity is mildly prominent but stable. The mediastinum is normal in width. There is no pleural effusion. The bony thorax exhibits no acute abnormality. IMPRESSION: Stable enlargement of the cardiac silhouette. No pulmonary vascular congestion,  pulmonary edema, nor pneumonia. Electronically Signed   By: David  SwazilandJordan M.D.   On: 07/19/2018 09:27   Dg Tibia/fibula Right Port  Result Date: 07/19/2018 CLINICAL DATA:  Run over by a forklift. EXAM: PORTABLE RIGHT TIBIA AND FIBULA - 2 VIEW COMPARISON:  None. FINDINGS: Comminuted fractures noted through the proximal shaft and mid to distal shaft of the left tibia with mild displacement. Mildly displaced mid to distal fibular shaft fracture also noted. Soft tissues appear intact. No subluxation or dislocation. IMPRESSION: Two separate levels of comminuted, slightly displaced fractures in the proximal tibia shaft and mid to distal tibia shaft. Mid to distal fibular shaft fracture. Electronically Signed   By: Charlett NoseKevin  Dover M.D.   On: 07/19/2018 09:27    Procedures Procedures (  including critical care time)  Medications Ordered in ED Medications  ceFAZolin (ANCEF) IVPB 1 g/50 mL premix (has no administration in time range)  HYDROmorphone (DILAUDID) injection 1 mg ( Intravenous Given 07/19/18 0915)  Tdap (BOOSTRIX) injection 0.5 mL (0.5 mLs Intramuscular Given 07/19/18 0915)  ceFAZolin (ANCEF) 2-4 GM/100ML-% IVPB (1 g  New Bag/Given 07/19/18 0915)     Initial Impression / Assessment and Plan / ED Course  I have reviewed the triage vital signs and the nursing notes.  Pertinent labs & imaging results that were available during my care of the patient were reviewed by me and considered in my medical decision making (see chart for details).     Patient is neurovascular intact and his only injury appears to be his right leg.  Given the puncture wound with continuous oozing, I am concerned for open fracture given the location compared to his fracture pattern.  He does not have firm compartments or initial concern for compartment syndrome.  He appears medically stable at this time.  Orthopedics will admit. Given IV dilaudid for pain.  Final Clinical Impressions(s) / ED Diagnoses   Final diagnoses:    Right leg injury, initial encounter  Type I or II open fracture of left tibia and fibula, initial encounter    ED Discharge Orders    None       Pricilla Loveless, MD 07/19/18 1001

## 2018-07-19 NOTE — Anesthesia Preprocedure Evaluation (Signed)
Anesthesia Evaluation  Patient identified by MRN, date of birth, ID band Patient awake    Reviewed: Allergy & Precautions, NPO status , Patient's Chart, lab work & pertinent test results  Airway Mallampati: II  TM Distance: >3 FB Neck ROM: Full    Dental  (+) Dental Advisory Given   Pulmonary neg pulmonary ROS,    breath sounds clear to auscultation       Cardiovascular hypertension, Pt. on medications  Rhythm:Regular Rate:Normal     Neuro/Psych negative neurological ROS     GI/Hepatic negative GI ROS, Neg liver ROS,   Endo/Other  diabetes, Type 2  Renal/GU negative Renal ROS     Musculoskeletal   Abdominal   Peds  Hematology negative hematology ROS (+)   Anesthesia Other Findings   Reproductive/Obstetrics                             Lab Results  Component Value Date   WBC 5.9 07/19/2018   HGB 12.4 (L) 07/19/2018   HCT 38.8 (L) 07/19/2018   MCV 103.5 (H) 07/19/2018   PLT 242 07/19/2018   Lab Results  Component Value Date   CREATININE 0.93 07/19/2018   BUN 11 07/19/2018   NA 141 07/19/2018   K 4.2 07/19/2018   CL 105 07/19/2018   CO2 25 07/19/2018    Anesthesia Physical Anesthesia Plan  ASA: II  Anesthesia Plan: General   Post-op Pain Management:    Induction: Intravenous  PONV Risk Score and Plan: 2 and Ondansetron, Dexamethasone and Treatment may vary due to age or medical condition  Airway Management Planned: Oral ETT  Additional Equipment:   Intra-op Plan:   Post-operative Plan: Extubation in OR  Informed Consent: I have reviewed the patients History and Physical, chart, labs and discussed the procedure including the risks, benefits and alternatives for the proposed anesthesia with the patient or authorized representative who has indicated his/her understanding and acceptance.   Dental advisory given  Plan Discussed with: CRNA  Anesthesia Plan Comments:          Anesthesia Quick Evaluation

## 2018-07-19 NOTE — Anesthesia Procedure Notes (Signed)
Procedure Name: Intubation Date/Time: 07/19/2018 5:34 PM Performed by: Barrington Ellison, CRNA Pre-anesthesia Checklist: Patient identified, Emergency Drugs available, Suction available and Patient being monitored Patient Re-evaluated:Patient Re-evaluated prior to induction Oxygen Delivery Method: Circle System Utilized Preoxygenation: Pre-oxygenation with 100% oxygen Induction Type: IV induction Ventilation: Mask ventilation without difficulty Laryngoscope Size: Mac and 4 Grade View: Grade I Tube type: Oral Tube size: 7.5 mm Number of attempts: 2 Airway Equipment and Method: Stylet and Oral airway Placement Confirmation: ETT inserted through vocal cords under direct vision,  positive ETCO2 and breath sounds checked- equal and bilateral Secured at: 22 cm Tube secured with: Tape Dental Injury: Teeth and Oropharynx as per pre-operative assessment

## 2018-07-20 ENCOUNTER — Other Ambulatory Visit: Payer: Self-pay

## 2018-07-20 ENCOUNTER — Encounter (HOSPITAL_COMMUNITY): Payer: Self-pay | Admitting: Orthopedic Surgery

## 2018-07-20 LAB — CBC
HEMATOCRIT: 35.9 % — AB (ref 39.0–52.0)
Hemoglobin: 11.8 g/dL — ABNORMAL LOW (ref 13.0–17.0)
MCH: 33.3 pg (ref 26.0–34.0)
MCHC: 32.9 g/dL (ref 30.0–36.0)
MCV: 101.4 fL — AB (ref 78.0–100.0)
Platelets: 216 10*3/uL (ref 150–400)
RBC: 3.54 MIL/uL — ABNORMAL LOW (ref 4.22–5.81)
RDW: 14.5 % (ref 11.5–15.5)
WBC: 5.4 10*3/uL (ref 4.0–10.5)

## 2018-07-20 LAB — BASIC METABOLIC PANEL
ANION GAP: 9 (ref 5–15)
BUN: 9 mg/dL (ref 6–20)
CO2: 28 mmol/L (ref 22–32)
Calcium: 8.6 mg/dL — ABNORMAL LOW (ref 8.9–10.3)
Chloride: 99 mmol/L (ref 98–111)
Creatinine, Ser: 0.89 mg/dL (ref 0.61–1.24)
GFR calc non Af Amer: >60 mL/min (ref >60)
GLUCOSE: 188 mg/dL — AB (ref 70–99)
POTASSIUM: 4.7 mmol/L (ref 3.5–5.1)
Sodium: 136 mmol/L (ref 135–145)

## 2018-07-20 LAB — HIV ANTIBODY (ROUTINE TESTING W REFLEX): HIV Screen 4th Generation wRfx: NONREACTIVE

## 2018-07-20 LAB — GLUCOSE, CAPILLARY
GLUCOSE-CAPILLARY: 129 mg/dL — AB (ref 70–99)
GLUCOSE-CAPILLARY: 197 mg/dL — AB (ref 70–99)
Glucose-Capillary: 173 mg/dL — ABNORMAL HIGH (ref 70–99)

## 2018-07-20 MED ORDER — ATORVASTATIN CALCIUM 20 MG PO TABS
20.0000 mg | ORAL_TABLET | Freq: Every day | ORAL | Status: DC
  Administered 2018-07-20: 20 mg via ORAL
  Filled 2018-07-20: qty 1

## 2018-07-20 NOTE — Evaluation (Signed)
Physical Therapy Evaluation Patient Details Name: Duane Collins MRN: [id number] DOB: 1961/01/03 Today's Date: 07/20/2018   History of Present Illness  Pt is a 57 y.o. male admitted 07/19/18 after getting run over by forklift sustaining tibial fx; now s/p IM nail fixation and I&D on 9/17. PMH includes HTN, DM.    Clinical Impression  Pt presents with an overall decrease in functional mobility secondary to above. PTA, pt indep, works, and lives with wife available for 24/7 support. Educ on precautions, positioning, therex, and importance of mobility. Today, pt able to take a couple steps on LLE with RW; limited by c/o LLE cramping with mobility requesting muscle relaxers (RN aware). Expect pt to progress well with mobility once pain controlled. Pt would benefit from continued acute PT services to maximize functional mobility and independence prior to d/c home. Would likely benefit from outpatient PT once WB orders progress.     Follow Up Recommendations Outpatient PT;Supervision for mobility/OOB    Equipment Recommendations  None recommended by PT    Recommendations for Other Services       Precautions / Restrictions Precautions Precautions: Fall Restrictions Weight Bearing Restrictions: Yes RLE Weight Bearing: Non weight bearing      Mobility  Bed Mobility Overal bed mobility: Needs Assistance Bed Mobility: Supine to Sit     Supine to sit: Min assist;HOB elevated     General bed mobility comments: MinA to assist RLE to EOB; educ on use of hands or LLE to self-assist with this movement. C/o signficant LLE cramping upon sitting  Transfers Overall transfer level: Needs assistance Equipment used: Rolling walker (2 wheeled) Transfers: Sit to/from Stand Sit to Stand: Supervision;From elevated surface         General transfer comment: Cues for correct hand placement; increased time and effort secondary to c/o LLE cramping. Good ability to maintain RLE NWB. Heavy reliance on  BUE support  Ambulation/Gait Ambulation/Gait assistance: Supervision Gait Distance (Feet): 2 Feet Assistive device: Rolling walker (2 wheeled)   Gait velocity: Decreased Gait velocity interpretation: <1.8 ft/sec, indicate of risk for recurrent falls General Gait Details: Took side scoots at EOB on LLE with RW and supervision; heavy reliance on BUE support. Difficulty completely clearing LLE foot off ground. Further distance limited by c/o worsening LE thigh cramping while standing  Stairs            Wheelchair Mobility    Modified Rankin (Stroke Patients Only)       Balance Overall balance assessment: Needs assistance   Sitting balance-Leahy Scale: Good     Standing balance support: Bilateral upper extremity supported Standing balance-Leahy Scale: Poor Standing balance comment: Reliant on UE support                             Pertinent Vitals/Pain Pain Assessment: Faces Faces Pain Scale: Hurts even more Pain Location: R LE, L thigh cramping Pain Descriptors / Indicators: Sore;Guarding;Grimacing Pain Intervention(s): Monitored during session;RN gave pain meds during session    Home Living Family/patient expects to be discharged to:: Private residence Living Arrangements: Spouse/significant other Available Help at Discharge: Family;Available 24 hours/day Type of Home: House Home Access: Stairs to enter   [employer] of Steps: 6 w/ rail in front (3 no rail in garage) Home Layout: One level Home Equipment: Dan Humphreys - 2 wheels;Crutches      Prior Function Level of Independence: Independent         Comments: Works. Drives  Hand Dominance        Extremity/Trunk Assessment   Upper Extremity Assessment Upper Extremity Assessment: Overall WFL for tasks assessed    Lower Extremity Assessment Lower Extremity Assessment: RLE deficits/detail RLE Deficits / Details: s/p tibial fx;  hip flex <3/5 RLE: Unable to fully assess due to  pain;Unable to fully assess due to immobilization       Communication   Communication: No difficulties  Cognition Arousal/Alertness: Awake/alert Behavior During Therapy: WFL for tasks assessed/performed Overall Cognitive Status: Within Functional Limits for tasks assessed                                        General Comments General comments (skin integrity, edema, etc.): Wife and brother present during session    Exercises     Assessment/Plan    PT Assessment Patient needs continued PT services  PT Problem List Decreased strength;Decreased range of motion;Decreased activity tolerance;Decreased balance;Decreased mobility;Decreased knowledge of use of DME;Decreased knowledge of precautions;Pain       PT Treatment Interventions DME instruction;Gait training;Stair training;Functional mobility training;Therapeutic activities;Therapeutic exercise;Balance training;Patient/family education    PT Goals (Current goals can be found in the Care Plan section)  Acute Rehab PT Goals Patient Stated Goal: Less pain and muscle cramping PT Goal Formulation: With patient Time For Goal Achievement: 08/03/18 Potential to Achieve Goals: Good    Frequency Min 5X/week   Barriers to discharge        Co-evaluation               AM-PAC PT "6 Clicks" Daily Activity  Outcome Measure Difficulty turning over in bed (including adjusting bedclothes, sheets and blankets)?: None Difficulty moving from lying on back to sitting on the side of the bed? : Unable Difficulty sitting down on and standing up from a chair with arms (e.g., wheelchair, bedside commode, etc,.)?: A Little Help needed moving to and from a bed to chair (including a wheelchair)?: A Little Help needed walking in hospital room?: A Lot Help needed climbing 3-5 steps with a railing? : A Lot 6 Click Score: 15    End of Session Equipment Utilized During Treatment: Gait belt Activity Tolerance: Patient limited by  pain Patient left: in bed;with call bell/phone within reach;with family/visitor present Nurse Communication: Mobility status PT Visit Diagnosis: Other abnormalities of gait and mobility (R26.89)    Time: [id number] PT Time Calculation (min) (ACUTE ONLY): 28 min   Charges:   PT Evaluation $PT Eval Low Complexity: 1 Low         Ina HomesJaclyn Ara Mano, PT, DPT Acute Rehabilitation Services  Pager [phone] Office [phone]  Malachy ChamberJaclyn L Avier Jech 07/20/2018, 10:10 AM

## 2018-07-20 NOTE — Anesthesia Postprocedure Evaluation (Signed)
Anesthesia Post Note  Patient: Arletha PiliDennis R Ruta  Procedure(s) Performed: INTRAMEDULLARY (IM) NAIL TIBIAL (Right Leg Lower)     Patient location during evaluation: PACU Anesthesia Type: General Level of consciousness: awake and alert Pain management: pain level controlled Vital Signs Assessment: post-procedure vital signs reviewed and stable Respiratory status: spontaneous breathing, nonlabored ventilation, respiratory function stable and patient connected to nasal cannula oxygen Cardiovascular status: blood pressure returned to baseline and stable Postop Assessment: no apparent nausea or vomiting Anesthetic complications: no    Last Vitals:  Vitals:   07/19/18 2210 07/19/18 2251  BP: (!) 143/74 (!) 148/76  Pulse: 69 74  Resp: 19 16  Temp:  36.7 C  SpO2: 99% 98%    Last Pain:  Vitals:   07/19/18 2251  TempSrc: Oral  PainSc:                  Jadon Harbaugh COKER

## 2018-07-20 NOTE — Progress Notes (Addendum)
Patient ID: Duane PiliDennis R [place], male   DOB: 01/09/1961, 57 y.o.   MRN: [id number]     Subjective:  Patient reports pain as mild to moderate.  Patient in bed and in no acute disterss  Objective:   VITALS:   Vitals:   07/19/18 2210 07/19/18 2251 07/20/18 0215 07/20/18 0413  BP: (!) 143/74 (!) 148/76 140/70 138/84  Pulse: 69 74 75 69  Resp: 19 16 16 18   Temp:  98 F (36.7 C) 98.2 F (36.8 C) 98 F (36.7 C)  TempSrc:  Oral Oral Oral  SpO2: 99% 98% 98% 100%  Weight:      Height:        ABD soft Sensation intact distally Dorsiflexion/Plantar flexion intact Incision: dressing C/D/I and no drainage short leg splint in place  Lab Results  Component Value Date   WBC 5.4 07/20/2018   HGB 11.8 (L) 07/20/2018   HCT 35.9 (L) 07/20/2018   MCV 101.4 (H) 07/20/2018   PLT 216 07/20/2018   BMET    Component Value Date/Time   NA 136 07/20/2018 0455   K 4.7 07/20/2018 0455   CL 99 07/20/2018 0455   CO2 28 07/20/2018 0455   GLUCOSE 188 (H) 07/20/2018 0455   BUN 9 07/20/2018 0455   CREATININE 0.89 07/20/2018 0455   CALCIUM 8.6 (L) 07/20/2018 0455   GFRNONAA >60 07/20/2018 0455   GFRAA >60 07/20/2018 0455     Assessment/Plan: 1 Day Post-Op   Principal Problem:   Tibia/fibula fracture, shaft, right, open type I segmental   Advance diet Up with therapy Plan for discharge tomorrow NWB right lower ext Dry dressing PRN    DOUGLAS PARRY, BRANDON 07/20/2018, 8:45 AM  Discussed and agree with above.   Teryl LucyJoshua Adonica Fukushima, MD Cell [phone]

## 2018-07-20 NOTE — Progress Notes (Signed)
OT Cancellation Note  Patient Details Name: Duane PiliDennis R Micucci MRN: [id number] DOB: 1961/02/18   Cancelled Treatment:    Reason Eval/Treat Not Completed: Other (comment)  Pt just worked with PT and having significant cramping- will check on pt later as schedule allows Lise AuerLori Shavawn Stobaugh, OT Acute Rehabilitation Services [phone] Office- [phone]     Aneesh Faller, Karin GoldenLorraine D 07/20/2018, 10:08 AM

## 2018-07-20 NOTE — Progress Notes (Signed)
Physical Therapy Treatment Patient Details Name: Duane Collins MRN: [id number] DOB: July 26, 1961 Today's Date: 07/20/2018    History of Present Illness Pt is a 57 y.o. male admitted 07/19/18 after getting run over by forklift sustaining tibial fx; now s/p IM nail fixation and I&D on 9/17. PMH includes HTN, DM.   PT Comments    Pt seen for second session this afternoon to progress gait training since limited by pain this morning. Able to amb with RW at supervision-level; good ability to maintain RLE NWB precautions. Mod indep with transfers. Pt declining crutch training, reports more comfortable with RW. Will plan for stair training next session prior to d/c home.    Follow Up Recommendations  Outpatient PT;Supervision for mobility/OOB     Equipment Recommendations  None recommended by PT    Recommendations for Other Services       Precautions / Restrictions Precautions Precautions: Fall Restrictions Weight Bearing Restrictions: Yes RLE Weight Bearing: Non weight bearing    Mobility  Bed Mobility Overal bed mobility: Modified Independent Bed Mobility: Supine to Sit     Supine to sit: Modified independent (Device/Increase time)        Transfers Overall transfer level: Modified independent Equipment used: Rolling walker (2 wheeled) Transfers: Sit to/from Stand Sit to Stand: Modified independent (Device/Increase time)            Ambulation/Gait Ambulation/Gait assistance: Supervision Gait Distance (Feet): 50 Feet Assistive device: Rolling walker (2 wheeled)   Gait velocity: Decreased Gait velocity interpretation: 1.31 - 2.62 ft/sec, indicative of limited community ambulator General Gait Details: Hop-to on LLE with RW; supervision for safety. 1x self-corrected LOB. Heavy reliance on BUE support   Stairs Stairs: (Pt declined this session)           Wheelchair Mobility    Modified Rankin (Stroke Patients Only)       Balance Overall balance  assessment: Needs assistance   Sitting balance-Leahy Scale: Good     Standing balance support: Bilateral upper extremity supported Standing balance-Leahy Scale: Fair Standing balance comment: Can static stand to void at toilet with no UE support                             Cognition Arousal/Alertness: Awake/alert Behavior During Therapy: WFL for tasks assessed/performed Overall Cognitive Status: Within Functional Limits for tasks assessed                                        Exercises      General Comments        Pertinent Vitals/Pain Pain Assessment: Faces Faces Pain Scale: Hurts a little bit Pain Location: RLE Pain Descriptors / Indicators: Sore Pain Intervention(s): Monitored during session    Home Living                      Prior Function            PT Goals (current goals can now be found in the care plan section) Acute Rehab PT Goals Patient Stated Goal: Return home PT Goal Formulation: With patient Time For Goal Achievement: 08/03/18 Potential to Achieve Goals: Good Progress towards PT goals: Progressing toward goals    Frequency    Min 5X/week      PT Plan Current plan remains appropriate    Co-evaluation  AM-PAC PT "6 Clicks" Daily Activity  Outcome Measure  Difficulty turning over in bed (including adjusting bedclothes, sheets and blankets)?: None Difficulty moving from lying on back to sitting on the side of the bed? : None Difficulty sitting down on and standing up from a chair with arms (e.g., wheelchair, bedside commode, etc,.)?: None Help needed moving to and from a bed to chair (including a wheelchair)?: None Help needed walking in hospital room?: A Little Help needed climbing 3-5 steps with a railing? : A Little 6 Click Score: 22    End of Session Equipment Utilized During Treatment: Gait belt Activity Tolerance: Patient tolerated treatment well Patient left: in chair;with  call bell/phone within reach Nurse Communication: Mobility status PT Visit Diagnosis: Other abnormalities of gait and mobility (R26.89)     Time: [id number] PT Time Calculation (min) (ACUTE ONLY): 18 min  Charges:  $Gait Training: 8-22 mins                    Ina HomesJaclyn Rexanna Louthan, PT, DPT Acute Rehabilitation Services  Pager [phone] Office [phone]  Malachy ChamberJaclyn L Davontay [place] 07/20/2018, 4:57 PM

## 2018-07-21 LAB — GLUCOSE, CAPILLARY
Glucose-Capillary: 264 mg/dL — ABNORMAL HIGH (ref 70–99)
Glucose-Capillary: 114 mg/dL — ABNORMAL HIGH (ref 70–99)

## 2018-07-21 NOTE — Care Management Note (Addendum)
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 Fax [phone], case # [id number]. Contacted [employer] CW to arrange delivery of DME [facility]. Contacted [facility] for RW and 3n1 for home. Faxed orders to CW to ship shower chair, and knee walker wheelchair to home. Outpt PT recommended. Message to attending and will hold OPPT at this time. Pt made aware.   Faxed dc summary. Pt instructed by surgeon not to use the knee walker until he instructs him to use.   07/21/2018 449 pm Received call back from CW, [name], [place] she will review dc papers and follow up with pt on getting additional DME. Also [facility] for reimbursement of RW and 3n1.   Expected Discharge Date:  07/21/2018              Expected Discharge Plan:  Home/Self Care  In-House Referral:  NA  Discharge planning Services  CM Consult  Post Acute Care Choice:  NA Choice offered to:  NA  DME Arranged:  3-N-1, Walker rolling, Lightweight manual wheelchair with seat cushion, Shower stool DME Agency:  [facility]  HH Arranged:  NA HH Agency:  NA  Status of Service:  Completed, signed off  If discussed at [employer] Meetings, dates discussed:    Additional Comments:  [name], [name], RN 07/21/2018, 2:20 PM

## 2018-07-21 NOTE — Discharge Summary (Signed)
Physician Discharge Summary  Patient ID: Duane Collins MRN: [id number] DOB/AGE: 1961/04/10 57 y.o.  Admit date: 07/19/2018 Discharge date: 07/21/2018  Admission Diagnoses:  Tibia/fibula fracture, shaft, right, open type I or II, initial encounter  Discharge Diagnoses:  Principal Problem:   Tibia/fibula fracture, shaft, right, open type I segmental   Past Medical History:  Diagnosis Date  . Diabetes mellitus without complication (HCC)   . High cholesterol   . Hypertension   . Tibia/fibula fracture, shaft, right, open type I segmental 07/19/2018    Surgeries: Procedure(s): INTRAMEDULLARY (IM) NAIL TIBIAL on 07/19/2018   Consultants (if any): Treatment Team:  Teryl LucyLandau, Josten Warmuth, MD  Discharged Condition: Improved  Hospital Course: Duane Collins is an 57 y.o. male who was admitted 07/19/2018 with a diagnosis of Tibia/fibula fracture, shaft, right, open type I or II, initial encounter and went to the operating room on 07/19/2018 and underwent the above named procedures.    He was given perioperative antibiotics:  Anti-infectives (From admission, onward)   Start     Dose/Rate Route Frequency Ordered Stop   07/19/18 2300  ceFAZolin (ANCEF) IVPB 2g/100 mL premix     2 g 200 mL/hr over 30 Minutes Intravenous Every 8 hours 07/19/18 2150 07/22/18 2159   07/19/18 1500  ceFAZolin (ANCEF) IVPB 2g/100 mL premix     2 g 200 mL/hr over 30 Minutes Intravenous To ShortStay Surgical 07/19/18 1028 07/19/18 1735   07/19/18 0915  ceFAZolin (ANCEF) IVPB 1 g/50 mL premix  Status:  Discontinued     1 g 100 mL/hr over 30 Minutes Intravenous  Once 07/19/18 0900 07/19/18 1047   07/19/18 0900  ceFAZolin (ANCEF) 2-4 GM/100ML-% IVPB    Note to Pharmacy:  Mervyn GayPierce, Catherine   : cabinet override      07/19/18 0900 07/19/18 0915   07/19/18 0000  doxycycline (VIBRAMYCIN) 50 MG capsule     50 mg Oral 2 times daily 07/19/18 2031      .  He was given sequential compression devices, early ambulation for DVT  prophylaxis.  He benefited maximally from the hospital stay and there were no complications.    Recent vital signs:  Vitals:   07/21/18 0529 07/21/18 1429  BP: 138/85 139/79  Pulse: 67 76  Resp: 20 16  Temp: 97.7 F (36.5 C) 98.2 F (36.8 C)  SpO2: 94% 100%    Recent laboratory studies:  Lab Results  Component Value Date   HGB 11.8 (L) 07/20/2018   HGB 12.4 (L) 07/19/2018   Lab Results  Component Value Date   WBC 5.4 07/20/2018   PLT 216 07/20/2018   Lab Results  Component Value Date   INR 0.96 07/19/2018   Lab Results  Component Value Date   NA 136 07/20/2018   K 4.7 07/20/2018   CL 99 07/20/2018   CO2 28 07/20/2018   BUN 9 07/20/2018   CREATININE 0.89 07/20/2018   GLUCOSE 188 (H) 07/20/2018    Discharge Medications:   Allergies as of 07/21/2018   No Known Allergies     Medication List    TAKE these medications   amLODipine 10 MG tablet Commonly known as:  NORVASC Take 10 mg by mouth daily.   baclofen 10 MG tablet Commonly known as:  LIORESAL Take 1 tablet (10 mg total) by mouth 3 (three) times daily. As needed for muscle spasm   carvedilol 3.125 MG tablet Commonly known as:  COREG Take 3.125 mg by mouth 2 (two) times daily.  citalopram 40 MG tablet Commonly known as:  CELEXA Take 40 mg by mouth daily.   doxycycline 50 MG capsule Commonly known as:  VIBRAMYCIN Take 1 capsule (50 mg total) by mouth 2 (two) times daily.   FARXIGA 10 MG Tabs tablet Generic drug:  dapagliflozin propanediol Take 10 mg by mouth daily.   ferrous sulfate 325 (65 FE) MG tablet Take 325 mg by mouth once a week.   metFORMIN 1000 MG tablet Commonly known as:  GLUCOPHAGE Take 1,000 mg by mouth 2 (two) times daily with a meal.   MUSCLE RUB ULTRA STRENGTH 02-09-29 % Crea Generic drug:  Camphor-Menthol-Methyl Sal Apply 1 application topically as needed (muscle cramps).   ondansetron 4 MG tablet Commonly known as:  ZOFRAN Take 1 tablet (4 mg total) by mouth every  8 (eight) hours as needed for nausea or vomiting.   oxyCODONE 5 MG immediate release tablet Commonly known as:  Oxy IR/ROXICODONE Take 1 tablet (5 mg total) by mouth every 4 (four) hours as needed for severe pain.   phentermine 37.5 MG tablet Commonly known as:  ADIPEX-P Take 37.5 mg by mouth daily.   pioglitazone 30 MG tablet Commonly known as:  ACTOS Take 30 mg by mouth daily.   sennosides-docusate sodium 8.6-50 MG tablet Commonly known as:  SENOKOT-S Take 2 tablets by mouth daily.   simvastatin 40 MG tablet Commonly known as:  ZOCOR Take 40 mg by mouth every evening.   Vitamin D (Ergocalciferol) 50000 units Caps capsule Commonly known as:  DRISDOL Take 50,000 Units by mouth once a week.   ZIPSOR 25 MG Caps Generic drug:  Diclofenac Potassium Take 25 mg by mouth 4 (four) times daily.            Durable Medical Equipment  (From admission, onward)         Start     Ordered   07/21/18 1052  For home use only DME lightweight manual wheelchair with seat cushion  Once    Comments:  Patient suffers from tibia/fibula fracture, s/p IM Nail which impairs their ability to perform daily activities like walking, standing and bathing in the home.  A rolling walker will not resolve  issue with performing activities of daily living. A wheelchair will allow patient to safely perform daily activities. Patient is not able to propel themselves in the home using a standard weight wheelchair due to non weight bearing, pain.  Patient can self propel in the lightweight wheelchair.  Accessories: elevating leg rests (ELRs), wheel locks, extensions and anti-tippers.   07/21/18 1052   07/21/18 1048  For home use only DME Shower stool  Once    Comments:  Shower stool with back   07/21/18 1048   07/21/18 1047  For home use only DME Walker rolling  Once    Comments:  Knee Walker  Question:  Patient needs a walker to treat with the following condition  Answer:  Tibia/fibula fracture   07/21/18  1048   07/21/18 1046  For home use only DME 3 n 1  Once     07/21/18 1048   07/21/18 1046  For home use only DME Walker rolling  Once    Question:  Patient needs a walker to treat with the following condition  Answer:  Tibia fracture   07/21/18 1048          Diagnostic Studies: Dg Tibia/fibula Right  Result Date: 07/19/2018 CLINICAL DATA:  Right tibial nail EXAM: DG C-ARM 61-120 MIN; RIGHT  TIBIA AND FIBULA - 2 VIEW COMPARISON:  07/19/2018 FINDINGS: Nine low resolution intraoperative spot views of the right tibia and fibula. Total fluoroscopy time was 128.3 seconds. Intramedullary rod with proximal and distal screw fixation of the tibia for comminuted distal shaft fracture. Additional sideplate and screw fixation of proximal tibial shaft fracture. Acute mildly displaced fracture distal shaft of the fibula. IMPRESSION: Intraoperative fluoroscopic assistance provided during surgical fixation of tibial fractures Electronically Signed   By: Jasmine Pang M.D.   On: 07/19/2018 21:31   Dg Chest Portable 1 View  Result Date: 07/19/2018 CLINICAL DATA:  Preoperative examination prior to ORIF for right tibia and fibular fracture. Former smoker. No current chest complaints. EXAM: PORTABLE CHEST 1 VIEW COMPARISON:  Chest x-ray of May 25, 2018 FINDINGS: The lungs are adequately inflated and clear. The cardiac silhouette remains enlarged. The central pulmonary vascularity is mildly prominent but stable. The mediastinum is normal in width. There is no pleural effusion. The bony thorax exhibits no acute abnormality. IMPRESSION: Stable enlargement of the cardiac silhouette. No pulmonary vascular congestion, pulmonary edema, nor pneumonia. Electronically Signed   By: David  [place] M.D.   On: 07/19/2018 09:27   Dg Tibia/fibula Right Port  Result Date: 07/19/2018 CLINICAL DATA:  Postoperative repair of tib-fib fractures. EXAM: PORTABLE RIGHT TIBIA AND FIBULA - 2 VIEW COMPARISON:  07/19/2018 FINDINGS: Multiple  fractures of the right tibia including oblique fracture of the proximal shaft and comminuted transverse fractures of the distal shaft. There has been intramedullary rod and screw fixation with 2 locking screws at the top and bottom of the rod. A plate and screw has been placed over the proximal fracture. Mostly transverse comminuted fracture of the distal right fibular shaft. Bones appear in near anatomic alignment with mild displacement of a butterfly fragment from the distal tibia. Overlying cast material obscures bone detail. IMPRESSION: Postoperative changes with intramedullary rod and screw fixation of comminuted fractures of the right tibia. Also transverse fracture of the distal right fibula. Electronically Signed   By: Burman Nieves M.D.   On: 07/19/2018 23:46   Dg Tibia/fibula Right Port  Result Date: 07/19/2018 CLINICAL DATA:  Run over by a forklift. EXAM: PORTABLE RIGHT TIBIA AND FIBULA - 2 VIEW COMPARISON:  None. FINDINGS: Comminuted fractures noted through the proximal shaft and mid to distal shaft of the left tibia with mild displacement. Mildly displaced mid to distal fibular shaft fracture also noted. Soft tissues appear intact. No subluxation or dislocation. IMPRESSION: Two separate levels of comminuted, slightly displaced fractures in the proximal tibia shaft and mid to distal tibia shaft. Mid to distal fibular shaft fracture. Electronically Signed   By: Charlett Nose M.D.   On: 07/19/2018 09:27   Dg C-arm 1-60 Min  Result Date: 07/19/2018 CLINICAL DATA:  Right tibial nail EXAM: DG C-ARM 61-120 MIN; RIGHT TIBIA AND FIBULA - 2 VIEW COMPARISON:  07/19/2018 FINDINGS: Nine low resolution intraoperative spot views of the right tibia and fibula. Total fluoroscopy time was 128.3 seconds. Intramedullary rod with proximal and distal screw fixation of the tibia for comminuted distal shaft fracture. Additional sideplate and screw fixation of proximal tibial shaft fracture. Acute mildly displaced  fracture distal shaft of the fibula. IMPRESSION: Intraoperative fluoroscopic assistance provided during surgical fixation of tibial fractures Electronically Signed   By: Jasmine Pang M.D.   On: 07/19/2018 21:31   Dg C-arm 1-60 Min  Result Date: 07/19/2018 CLINICAL DATA:  Right tibial nail EXAM: DG C-ARM 61-120 MIN; RIGHT TIBIA AND FIBULA -  2 VIEW COMPARISON:  07/19/2018 FINDINGS: Nine low resolution intraoperative spot views of the right tibia and fibula. Total fluoroscopy time was 128.3 seconds. Intramedullary rod with proximal and distal screw fixation of the tibia for comminuted distal shaft fracture. Additional sideplate and screw fixation of proximal tibial shaft fracture. Acute mildly displaced fracture distal shaft of the fibula. IMPRESSION: Intraoperative fluoroscopic assistance provided during surgical fixation of tibial fractures Electronically Signed   By: Jasmine Pang M.D.   On: 07/19/2018 21:31    Disposition: Discharge disposition: 01-Home or Self Care         Follow-up Information    Teryl Lucy, MD. Schedule an appointment as soon as possible for a visit in 2 weeks.   Specialty:  Orthopedic Surgery Contact information: [address] [place] [address] [phone]        [facility] - Dme Follow up.   Why:  [facility] will deliver Rolling Walker and 3n1 bedside commode to room prior to discharge. please follow up with your Worker's Comp Caseworker, Bing Matter for light weight manual wheelchair, and shower chair. # [phone] ext [phone] or [phone].  Contact information: [address] [place] [address] [phone]            Signed: Eulas Post 07/21/2018, 3:15 PM

## 2018-07-21 NOTE — Progress Notes (Signed)
Physical Therapy Treatment Patient Details Name: Duane Collins MRN: [id number] DOB: 12-02-1960 Today's Date: 07/21/2018    History of Present Illness Pt is a 57 y.o. male admitted 07/19/18 after getting run over by forklift sustaining tibial fx; now s/p IM nail fixation and I&D on 9/17. PMH includes HTN, DM.    PT Comments    Pt making steady progress with functional mobility and tolerated ambulating a further distance this session. Pt needs to participate in stair training prior to d/c home. However, no family members present this session to participate as well (needed as pt reported 2-3 steps with no rail to enter; will need family member present to educate). Pt would continue to benefit from skilled physical therapy services at this time while admitted and after d/c to address the below listed limitations in order to improve overall safety and independence with functional mobility.   Follow Up Recommendations  Outpatient PT;Supervision for mobility/OOB     Equipment Recommendations  None recommended by PT    Recommendations for Other Services       Precautions / Restrictions Precautions Precautions: Fall Restrictions Weight Bearing Restrictions: Yes RLE Weight Bearing: Non weight bearing    Mobility  Bed Mobility Overal bed mobility: Modified Independent                Transfers Overall transfer level: Modified independent Equipment used: Rolling walker (2 wheeled)                Ambulation/Gait Ambulation/Gait assistance: Supervision Gait Distance (Feet): 100 Feet Assistive device: Rolling walker (2 wheeled) Gait Pattern/deviations: (hop-to on L LE) Gait velocity: Decreased Gait velocity interpretation: <1.31 ft/sec, indicative of household ambulator General Gait Details: pt steady with RW, able to maintain NWB R LE throughout independently; several standing rest breaks required secondary to fatigue   Stairs             Wheelchair  Mobility    Modified Rankin (Stroke Patients Only)       Balance Overall balance assessment: Needs assistance Sitting-balance support: No upper extremity supported Sitting balance-Leahy Scale: Good     Standing balance support: Bilateral upper extremity supported Standing balance-Leahy Scale: Poor                              Cognition Arousal/Alertness: Awake/alert Behavior During Therapy: WFL for tasks assessed/performed Overall Cognitive Status: Within Functional Limits for tasks assessed                                        Exercises Total Joint Exercises Long Arc Quad: AROM;Strengthening;Right;10 reps;Seated Knee Flexion: AAROM;Strengthening;Right;10 reps;Seated    General Comments        Pertinent Vitals/Pain Pain Assessment: 0-10 Pain Score: 10-Worst pain ever Pain Location: RLE Pain Descriptors / Indicators: Sore Pain Intervention(s): Monitored during session;Repositioned    Home Living                      Prior Function            PT Goals (current goals can now be found in the care plan section) Acute Rehab PT Goals PT Goal Formulation: With patient Time For Goal Achievement: 08/03/18 Potential to Achieve Goals: Good Progress towards PT goals: Progressing toward goals    Frequency    Min 5X/week  PT Plan Current plan remains appropriate    Co-evaluation              AM-PAC PT "6 Clicks" Daily Activity  Outcome Measure  Difficulty turning over in bed (including adjusting bedclothes, sheets and blankets)?: None Difficulty moving from lying on back to sitting on the side of the bed? : None Difficulty sitting down on and standing up from a chair with arms (e.g., wheelchair, bedside commode, etc,.)?: Unable Help needed moving to and from a bed to chair (including a wheelchair)?: None Help needed walking in hospital room?: A Little Help needed climbing 3-5 steps with a railing? : A Little 6  Click Score: 19    End of Session Equipment Utilized During Treatment: Gait belt Activity Tolerance: Patient tolerated treatment well Patient left: in bed;with call bell/phone within reach Nurse Communication: Mobility status PT Visit Diagnosis: Other abnormalities of gait and mobility (R26.89)     Time: [id number] PT Time Calculation (min) (ACUTE ONLY): 19 min  Charges:  $Gait Training: 8-22 mins                     Deborah ChalkJennifer Katina Remick, [place]PT, DPT  Acute Rehabilitation Services Pager [phone] Office [phone]     Alessandra BevelsJennifer M Lulla [place] 07/21/2018, 10:59 AM

## 2018-07-21 NOTE — Progress Notes (Signed)
Discharge instructions completed with pt.  Pt verbalized understanding of the information.  Pt denies chest pain, shortness of breath, dizziness, lightheadedness, and n/v.  Pt's IV discontinued.  Pt discharged home.  

## 2019-03-24 ENCOUNTER — Encounter (HOSPITAL_COMMUNITY): Payer: Self-pay | Admitting: *Deleted

## 2019-03-24 ENCOUNTER — Other Ambulatory Visit (HOSPITAL_COMMUNITY)
Admission: RE | Admit: 2019-03-24 | Discharge: 2019-03-24 | Disposition: A | Discharge status: Home / Self Care | Payer: Self-pay | Source: Ambulatory Visit | Attending: Orthopedic Surgery | Admitting: Orthopedic Surgery

## 2019-03-24 ENCOUNTER — Other Ambulatory Visit: Payer: Self-pay

## 2019-03-24 DIAGNOSIS — Z1159 Encounter for screening for other viral diseases: Secondary | ICD-10-CM | POA: Diagnosis present

## 2019-03-24 NOTE — Progress Notes (Signed)
[name] denies chest pain or shortness of breath. Patient denies that he nor his family has experienced any of the following: Cough Fever >100.4 Runny Nose Sore Throat Difficulty breathing/ shortness of breath Travel in past 14 days Mr. Duane Collins CBGs run 100- 115, he reports that last A1C was 6.2.  PCP is Clelia Croft, NP at [facility] in [place]. I instructed patine tto not take any pills for diabetes on Tuesday AM.  I instructed patient to check CBG after awaking and every 2 hours until arrival  to the hospital.  I Instructed patient if CBG is less than 70 to drnik 1/2 cup of a clear juice. Recheck CBG in 15 minutes then call pre- op desk at [phone] for further instructions.

## 2019-03-25 LAB — NOVEL CORONAVIRUS, NAA (HOSP ORDER, SEND-OUT TO REF LAB; TAT 18-24 HRS): SARS-CoV-2, NAA: NOT DETECTED

## 2019-03-27 NOTE — Anesthesia Preprocedure Evaluation (Addendum)
Anesthesia Evaluation  Patient identified by MRN, date of birth, ID band Patient awake    Reviewed: Allergy & Precautions, NPO status , Patient's Chart, lab work & pertinent test results, reviewed documented beta blocker date and time   History of Anesthesia Complications Negative for: history of anesthetic complications  Airway Mallampati: II  TM Distance: >3 FB Neck ROM: Full    Dental  (+) Dental Advisory Given   Pulmonary former smoker (quit 2000),  5/22 SARS Coronavirus NEG   breath sounds clear to auscultation       Cardiovascular hypertension, Pt. on medications and Pt. on home beta blockers (-) angina Rhythm:Regular Rate:Normal     Neuro/Psych Anxiety negative neurological ROS     GI/Hepatic negative GI ROS, Neg liver ROS,   Endo/Other  diabetes (glu 102), Oral Hypoglycemic AgentsMorbid obesity  Renal/GU negative Renal ROS     Musculoskeletal   Abdominal (+) + obese,   Peds  Hematology negative hematology ROS (+)   Anesthesia Other Findings   Reproductive/Obstetrics                           Anesthesia Physical Anesthesia Plan  ASA: II  Anesthesia Plan: General   Post-op Pain Management:    Induction: Intravenous  PONV Risk Score and Plan: 3 and Scopolamine patch - Pre-op, Dexamethasone and Ondansetron  Airway Management Planned: Oral ETT  Additional Equipment:   Intra-op Plan:   Post-operative Plan: Extubation in OR  Informed Consent: I have reviewed the patients History and Physical, chart, labs and discussed the procedure including the risks, benefits and alternatives for the proposed anesthesia with the patient or authorized representative who has indicated his/her understanding and acceptance.     Dental advisory given  Plan Discussed with: CRNA and Surgeon  Anesthesia Plan Comments:        Anesthesia Quick Evaluation

## 2019-03-28 ENCOUNTER — Encounter (HOSPITAL_COMMUNITY): Payer: Self-pay

## 2019-03-28 ENCOUNTER — Other Ambulatory Visit: Payer: Self-pay

## 2019-03-28 ENCOUNTER — Encounter (HOSPITAL_COMMUNITY)
Admission: RE | Disposition: A | Discharge status: Home / Self Care | Payer: Self-pay | Source: Home / Self Care | Attending: Orthopedic Surgery

## 2019-03-28 ENCOUNTER — Ambulatory Visit (HOSPITAL_COMMUNITY): Payer: No Typology Code available for payment source

## 2019-03-28 ENCOUNTER — Ambulatory Visit (HOSPITAL_COMMUNITY)
Admission: RE | Admit: 2019-03-28 | Discharge: 2019-03-30 | Disposition: A | Discharge status: Home / Self Care | Payer: No Typology Code available for payment source | Attending: Orthopedic Surgery | Admitting: Orthopedic Surgery

## 2019-03-28 ENCOUNTER — Ambulatory Visit (HOSPITAL_COMMUNITY): Payer: No Typology Code available for payment source | Admitting: Certified Registered Nurse Anesthetist

## 2019-03-28 DIAGNOSIS — Z6833 Body mass index (BMI) 33.0-33.9, adult: Secondary | ICD-10-CM | POA: Insufficient documentation

## 2019-03-28 DIAGNOSIS — S82201M Unspecified fracture of shaft of right tibia, subsequent encounter for open fracture type I or II with nonunion: Secondary | ICD-10-CM | POA: Diagnosis present

## 2019-03-28 DIAGNOSIS — E119 Type 2 diabetes mellitus without complications: Secondary | ICD-10-CM | POA: Diagnosis not present

## 2019-03-28 DIAGNOSIS — Z7984 Long term (current) use of oral hypoglycemic drugs: Secondary | ICD-10-CM | POA: Diagnosis not present

## 2019-03-28 DIAGNOSIS — I10 Essential (primary) hypertension: Secondary | ICD-10-CM | POA: Diagnosis present

## 2019-03-28 DIAGNOSIS — Z794 Long term (current) use of insulin: Secondary | ICD-10-CM | POA: Insufficient documentation

## 2019-03-28 DIAGNOSIS — M1990 Unspecified osteoarthritis, unspecified site: Secondary | ICD-10-CM | POA: Insufficient documentation

## 2019-03-28 DIAGNOSIS — E291 Testicular hypofunction: Secondary | ICD-10-CM | POA: Diagnosis not present

## 2019-03-28 DIAGNOSIS — S82201B Unspecified fracture of shaft of right tibia, initial encounter for open fracture type I or II: Secondary | ICD-10-CM

## 2019-03-28 DIAGNOSIS — Z79899 Other long term (current) drug therapy: Secondary | ICD-10-CM | POA: Insufficient documentation

## 2019-03-28 DIAGNOSIS — E559 Vitamin D deficiency, unspecified: Secondary | ICD-10-CM | POA: Diagnosis not present

## 2019-03-28 DIAGNOSIS — F419 Anxiety disorder, unspecified: Secondary | ICD-10-CM | POA: Diagnosis not present

## 2019-03-28 DIAGNOSIS — Z7982 Long term (current) use of aspirin: Secondary | ICD-10-CM | POA: Diagnosis not present

## 2019-03-28 DIAGNOSIS — E7800 Pure hypercholesterolemia, unspecified: Secondary | ICD-10-CM | POA: Insufficient documentation

## 2019-03-28 DIAGNOSIS — X58XXXD Exposure to other specified factors, subsequent encounter: Secondary | ICD-10-CM | POA: Insufficient documentation

## 2019-03-28 DIAGNOSIS — Z419 Encounter for procedure for purposes other than remedying health state, unspecified: Secondary | ICD-10-CM

## 2019-03-28 DIAGNOSIS — Z87891 Personal history of nicotine dependence: Secondary | ICD-10-CM | POA: Diagnosis not present

## 2019-03-28 DIAGNOSIS — T8489XD Other specified complication of internal orthopedic prosthetic devices, implants and grafts, subsequent encounter: Secondary | ICD-10-CM | POA: Insufficient documentation

## 2019-03-28 DIAGNOSIS — S82401M Unspecified fracture of shaft of right fibula, subsequent encounter for open fracture type I or II with nonunion: Secondary | ICD-10-CM | POA: Diagnosis present

## 2019-03-28 HISTORY — PX: TIBIA IM NAIL INSERTION: SHX2516

## 2019-03-28 HISTORY — DX: Anxiety disorder, unspecified: F41.9

## 2019-03-28 HISTORY — PX: IM NAILING TIBIA: SUR734

## 2019-03-28 HISTORY — DX: Vitamin D deficiency, unspecified: E55.9

## 2019-03-28 HISTORY — PX: HARDWARE REMOVAL: SHX979

## 2019-03-28 HISTORY — DX: Unspecified osteoarthritis, unspecified site: M19.90

## 2019-03-28 LAB — COMPREHENSIVE METABOLIC PANEL
ALT: 16 U/L (ref 0–44)
AST: 18 U/L (ref 15–41)
Albumin: 3.8 g/dL (ref 3.5–5.0)
Alkaline Phosphatase: 105 U/L (ref 38–126)
Anion gap: 10 (ref 5–15)
BUN: 14 mg/dL (ref 6–20)
CO2: 23 mmol/L (ref 22–32)
Calcium: 9 mg/dL (ref 8.9–10.3)
Chloride: 106 mmol/L (ref 98–111)
Creatinine, Ser: 1.03 mg/dL (ref 0.61–1.24)
GFR calc Af Amer: >60 mL/min (ref >60)
GFR calc non Af Amer: >60 mL/min (ref >60)
Glucose, Bld: 96 mg/dL (ref 70–99)
Potassium: 4 mmol/L (ref 3.5–5.1)
Sodium: 139 mmol/L (ref 135–145)
Total Bilirubin: 0.5 mg/dL (ref 0.3–1.2)
Total Protein: 6.7 g/dL (ref 6.5–8.1)

## 2019-03-28 LAB — CBC WITH DIFFERENTIAL/PLATELET
Abs Immature Granulocytes: 0.01 10*3/uL (ref 0.00–0.07)
Basophils Absolute: 0 10*3/uL (ref 0.0–0.1)
Basophils Relative: 0 %
Eosinophils Absolute: 0.1 10*3/uL (ref 0.0–0.5)
Eosinophils Relative: 1 %
HCT: 39.4 % (ref 39.0–52.0)
Hemoglobin: 12.6 g/dL — ABNORMAL LOW (ref 13.0–17.0)
Immature Granulocytes: 0 %
Lymphocytes Relative: 50 %
Lymphs Abs: 2.7 10*3/uL (ref 0.7–4.0)
MCH: 31.7 pg (ref 26.0–34.0)
MCHC: 32 g/dL (ref 30.0–36.0)
MCV: 99.2 fL (ref 80.0–100.0)
Monocytes Absolute: 0.6 10*3/uL (ref 0.1–1.0)
Monocytes Relative: 11 %
Neutro Abs: 2.1 10*3/uL (ref 1.7–7.7)
Neutrophils Relative %: 38 %
Platelets: 273 10*3/uL (ref 150–400)
RBC: 3.97 MIL/uL — ABNORMAL LOW (ref 4.22–5.81)
RDW: 13.6 % (ref 11.5–15.5)
WBC: 5.6 10*3/uL (ref 4.0–10.5)
nRBC: 0 % (ref 0.0–0.2)

## 2019-03-28 LAB — PROTIME-INR
INR: 1 (ref 0.8–1.2)
Prothrombin Time: 13.1 seconds (ref 11.4–15.2)

## 2019-03-28 LAB — HEMOGLOBIN A1C
Hgb A1c MFr Bld: 6.9 % — ABNORMAL HIGH (ref 4.8–5.6)
Mean Plasma Glucose: 151.33 mg/dL

## 2019-03-28 LAB — GLUCOSE, CAPILLARY
Glucose-Capillary: 102 mg/dL — ABNORMAL HIGH (ref 70–99)
Glucose-Capillary: 127 mg/dL — ABNORMAL HIGH (ref 70–99)

## 2019-03-28 LAB — APTT: aPTT: 31 seconds (ref 24–36)

## 2019-03-28 SURGERY — INSERTION, INTRAMEDULLARY ROD, TIBIA
Anesthesia: General | Site: Leg Lower | Laterality: Right

## 2019-03-28 MED ORDER — DOCUSATE SODIUM 100 MG PO CAPS
100.0000 mg | ORAL_CAPSULE | Freq: Two times a day (BID) | ORAL | Status: DC
  Administered 2019-03-28 – 2019-03-30 (×4): 100 mg via ORAL
  Filled 2019-03-28 (×4): qty 1

## 2019-03-28 MED ORDER — HYDROCODONE-ACETAMINOPHEN 7.5-325 MG PO TABS
ORAL_TABLET | ORAL | Status: AC
  Filled 2019-03-28: qty 2

## 2019-03-28 MED ORDER — ACETAMINOPHEN 325 MG PO TABS: 325.0000 mg | ORAL_TABLET | Freq: Four times a day (QID) | ORAL | Status: DC | PRN

## 2019-03-28 MED ORDER — MORPHINE SULFATE (PF) 2 MG/ML IV SOLN
0.5000 mg | INTRAVENOUS | Status: DC | PRN
  Administered 2019-03-28 – 2019-03-29 (×3): 1 mg via INTRAVENOUS
  Filled 2019-03-28 (×3): qty 1

## 2019-03-28 MED ORDER — METHOCARBAMOL 1000 MG/10ML IJ SOLN
500.0000 mg | Freq: Four times a day (QID) | INTRAVENOUS | Status: DC | PRN
  Filled 2019-03-28: qty 5

## 2019-03-28 MED ORDER — 0.9 % SODIUM CHLORIDE (POUR BTL) OPTIME
TOPICAL | Status: DC | PRN
  Administered 2019-03-28: 1000 mL

## 2019-03-28 MED ORDER — HYDROMORPHONE HCL 1 MG/ML IJ SOLN
INTRAMUSCULAR | Status: AC
  Filled 2019-03-28: qty 1

## 2019-03-28 MED ORDER — FENTANYL CITRATE (PF) 250 MCG/5ML IJ SOLN
INTRAMUSCULAR | Status: AC
  Filled 2019-03-28: qty 5

## 2019-03-28 MED ORDER — LIDOCAINE 2% (20 MG/ML) 5 ML SYRINGE
INTRAMUSCULAR | Status: DC | PRN
  Administered 2019-03-28: 50 mg via INTRAVENOUS

## 2019-03-28 MED ORDER — FENTANYL CITRATE (PF) 100 MCG/2ML IJ SOLN
INTRAMUSCULAR | Status: DC | PRN
  Administered 2019-03-28: 150 ug via INTRAVENOUS
  Administered 2019-03-28: 50 ug via INTRAVENOUS
  Administered 2019-03-28: 100 ug via INTRAVENOUS

## 2019-03-28 MED ORDER — HYDROMORPHONE HCL 1 MG/ML IJ SOLN
0.2500 mg | INTRAMUSCULAR | Status: DC | PRN
  Administered 2019-03-28 (×4): 0.5 mg via INTRAVENOUS

## 2019-03-28 MED ORDER — MIDAZOLAM HCL 2 MG/2ML IJ SOLN
INTRAMUSCULAR | Status: DC | PRN
  Administered 2019-03-28: 2 mg via INTRAVENOUS

## 2019-03-28 MED ORDER — ONDANSETRON HCL 4 MG PO TABS: 4.0000 mg | ORAL_TABLET | Freq: Four times a day (QID) | ORAL | Status: DC | PRN

## 2019-03-28 MED ORDER — MIDAZOLAM HCL 2 MG/2ML IJ SOLN
INTRAMUSCULAR | Status: AC
  Filled 2019-03-28: qty 2

## 2019-03-28 MED ORDER — ONDANSETRON HCL 4 MG/2ML IJ SOLN
INTRAMUSCULAR | Status: DC | PRN
  Administered 2019-03-28: 4 mg via INTRAVENOUS

## 2019-03-28 MED ORDER — DEXAMETHASONE SODIUM PHOSPHATE 10 MG/ML IJ SOLN
INTRAMUSCULAR | Status: AC
  Filled 2019-03-28: qty 1

## 2019-03-28 MED ORDER — ENOXAPARIN SODIUM 40 MG/0.4ML ~~LOC~~ SOLN
40.0000 mg | SUBCUTANEOUS | Status: DC
  Administered 2019-03-29 – 2019-03-30 (×2): 40 mg via SUBCUTANEOUS
  Filled 2019-03-28 (×2): qty 0.4

## 2019-03-28 MED ORDER — MIDAZOLAM HCL 2 MG/2ML IJ SOLN: 0.5000 mg | Freq: Once | INTRAMUSCULAR | Status: DC | PRN

## 2019-03-28 MED ORDER — CHLORHEXIDINE GLUCONATE 4 % EX LIQD: 60.0000 mL | Freq: Once | CUTANEOUS | Status: DC

## 2019-03-28 MED ORDER — CEFAZOLIN SODIUM-DEXTROSE 2-3 GM-%(50ML) IV SOLR
INTRAVENOUS | Status: DC | PRN
  Administered 2019-03-28: 2 g via INTRAVENOUS

## 2019-03-28 MED ORDER — ONDANSETRON HCL 4 MG/2ML IJ SOLN
INTRAMUSCULAR | Status: AC
  Filled 2019-03-28: qty 2

## 2019-03-28 MED ORDER — ROCURONIUM BROMIDE 10 MG/ML (PF) SYRINGE
PREFILLED_SYRINGE | INTRAVENOUS | Status: DC | PRN
  Administered 2019-03-28: 70 mg via INTRAVENOUS
  Administered 2019-03-28: 20 mg via INTRAVENOUS

## 2019-03-28 MED ORDER — HYDROMORPHONE HCL 1 MG/ML IJ SOLN
INTRAMUSCULAR | Status: AC
  Filled 2019-03-28: qty 2

## 2019-03-28 MED ORDER — EPHEDRINE SULFATE-NACL 50-0.9 MG/10ML-% IV SOSY
PREFILLED_SYRINGE | INTRAVENOUS | Status: DC | PRN
  Administered 2019-03-28: 5 mg via INTRAVENOUS
  Administered 2019-03-28: 15 mg via INTRAVENOUS
  Administered 2019-03-28: 10 mg via INTRAVENOUS

## 2019-03-28 MED ORDER — ACETAMINOPHEN 500 MG PO TABS
ORAL_TABLET | ORAL | Status: AC
  Administered 2019-03-28: 1000 mg via ORAL
  Filled 2019-03-28: qty 2

## 2019-03-28 MED ORDER — PROMETHAZINE HCL 25 MG/ML IJ SOLN: 6.2500 mg | INTRAMUSCULAR | Status: DC | PRN

## 2019-03-28 MED ORDER — HYDROCODONE-ACETAMINOPHEN 7.5-325 MG PO TABS
1.0000 | ORAL_TABLET | ORAL | Status: DC | PRN
  Administered 2019-03-28 – 2019-03-29 (×7): 2 via ORAL
  Filled 2019-03-28 (×6): qty 2

## 2019-03-28 MED ORDER — POTASSIUM CHLORIDE IN NACL 20-0.9 MEQ/L-% IV SOLN
INTRAVENOUS | Status: DC
  Administered 2019-03-28 – 2019-03-29 (×2): via INTRAVENOUS
  Filled 2019-03-28 (×2): qty 1000

## 2019-03-28 MED ORDER — PROPOFOL 10 MG/ML IV BOLUS
INTRAVENOUS | Status: DC | PRN
  Administered 2019-03-28: 200 mg via INTRAVENOUS

## 2019-03-28 MED ORDER — METHOCARBAMOL 500 MG PO TABS
500.0000 mg | ORAL_TABLET | Freq: Four times a day (QID) | ORAL | Status: DC | PRN
  Administered 2019-03-28 – 2019-03-29 (×4): 500 mg via ORAL
  Filled 2019-03-28 (×4): qty 1

## 2019-03-28 MED ORDER — HYDROCODONE-ACETAMINOPHEN 5-325 MG PO TABS: 1.0000 | ORAL_TABLET | ORAL | Status: DC | PRN

## 2019-03-28 MED ORDER — SUGAMMADEX SODIUM 200 MG/2ML IV SOLN
INTRAVENOUS | Status: DC | PRN
  Administered 2019-03-28: 400 mg via INTRAVENOUS

## 2019-03-28 MED ORDER — ONDANSETRON HCL 4 MG/2ML IJ SOLN: 4.0000 mg | Freq: Four times a day (QID) | INTRAMUSCULAR | Status: DC | PRN

## 2019-03-28 MED ORDER — SCOPOLAMINE 1 MG/3DAYS TD PT72
1.0000 | MEDICATED_PATCH | Freq: Once | TRANSDERMAL | Status: DC
  Administered 2019-03-28: 1.5 mg via TRANSDERMAL

## 2019-03-28 MED ORDER — ACETAMINOPHEN 500 MG PO TABS
1000.0000 mg | ORAL_TABLET | Freq: Once | ORAL | Status: AC
  Administered 2019-03-28: 1000 mg via ORAL

## 2019-03-28 MED ORDER — METOCLOPRAMIDE HCL 5 MG/ML IJ SOLN: 5.0000 mg | Freq: Three times a day (TID) | INTRAMUSCULAR | Status: DC | PRN

## 2019-03-28 MED ORDER — METOCLOPRAMIDE HCL 5 MG PO TABS: 5.0000 mg | ORAL_TABLET | Freq: Three times a day (TID) | ORAL | Status: DC | PRN

## 2019-03-28 MED ORDER — CEFAZOLIN SODIUM-DEXTROSE 1-4 GM/50ML-% IV SOLN
1.0000 g | Freq: Four times a day (QID) | INTRAVENOUS | Status: AC
  Administered 2019-03-28 – 2019-03-29 (×3): 1 g via INTRAVENOUS
  Filled 2019-03-28 (×3): qty 50

## 2019-03-28 MED ORDER — CARVEDILOL 3.125 MG PO TABS
ORAL_TABLET | ORAL | Status: AC
  Filled 2019-03-28: qty 1

## 2019-03-28 MED ORDER — PROPOFOL 10 MG/ML IV BOLUS
INTRAVENOUS | Status: AC
  Filled 2019-03-28: qty 20

## 2019-03-28 MED ORDER — CEFAZOLIN SODIUM 1 G IJ SOLR
INTRAMUSCULAR | Status: AC
  Filled 2019-03-28: qty 40

## 2019-03-28 MED ORDER — MEPERIDINE HCL 25 MG/ML IJ SOLN: 6.2500 mg | INTRAMUSCULAR | Status: DC | PRN

## 2019-03-28 MED ORDER — SCOPOLAMINE 1 MG/3DAYS TD PT72
MEDICATED_PATCH | TRANSDERMAL | Status: AC
  Administered 2019-03-28: 1.5 mg via TRANSDERMAL
  Filled 2019-03-28: qty 1

## 2019-03-28 MED ORDER — DEXAMETHASONE SODIUM PHOSPHATE 10 MG/ML IJ SOLN
INTRAMUSCULAR | Status: DC | PRN
  Administered 2019-03-28: 4 mg via INTRAVENOUS

## 2019-03-28 MED ORDER — LACTATED RINGERS IV SOLN
INTRAVENOUS | Status: DC
  Administered 2019-03-28 (×2): via INTRAVENOUS

## 2019-03-28 SURGICAL SUPPLY — 82 items
BANDAGE ACE 4X5 VEL STRL LF (GAUZE/BANDAGES/DRESSINGS) ×1 IMPLANT
BANDAGE ACE 6X5 VEL STRL LF (GAUZE/BANDAGES/DRESSINGS) ×3 IMPLANT
BANDAGE ESMARK 6X9 LF (GAUZE/BANDAGES/DRESSINGS) ×1 IMPLANT
BIT DRILL 3.8X6 NS (BIT) ×2 IMPLANT
BIT DRILL 4.4 NS (BIT) ×2 IMPLANT
BLADE SURG 10 STRL SS (BLADE) ×3 IMPLANT
BNDG COHESIVE 6X5 TAN STRL LF (GAUZE/BANDAGES/DRESSINGS) ×3 IMPLANT
BNDG ESMARK 6X9 LF (GAUZE/BANDAGES/DRESSINGS) ×3
BNDG GAUZE ELAST 4 BULKY (GAUZE/BANDAGES/DRESSINGS) ×2 IMPLANT
BRUSH SCRUB SURG 4.25 DISP (MISCELLANEOUS) ×6 IMPLANT
CLOSURE WOUND 1/2 X4 (GAUZE/BANDAGES/DRESSINGS)
COVER SURGICAL LIGHT HANDLE (MISCELLANEOUS) ×6 IMPLANT
COVER WAND RF STERILE (DRAPES) ×1 IMPLANT
CUFF TOURNIQUET SINGLE 18IN (TOURNIQUET CUFF) IMPLANT
CUFF TOURNIQUET SINGLE 24IN (TOURNIQUET CUFF) IMPLANT
CUFF TOURNIQUET SINGLE 34IN LL (TOURNIQUET CUFF) IMPLANT
DRAPE C-ARM 42X72 X-RAY (DRAPES) ×3 IMPLANT
DRAPE C-ARMOR (DRAPES) ×3 IMPLANT
DRAPE HALF SHEET 40X57 (DRAPES) IMPLANT
DRAPE INCISE IOBAN 66X45 STRL (DRAPES) ×2 IMPLANT
DRAPE U-SHAPE 47X51 STRL (DRAPES) ×3 IMPLANT
DRSG ADAPTIC 3X8 NADH LF (GAUZE/BANDAGES/DRESSINGS) ×1 IMPLANT
DRSG MEPITEL 4X7.2 (GAUZE/BANDAGES/DRESSINGS) ×3 IMPLANT
DRSG PAD ABDOMINAL 8X10 ST (GAUZE/BANDAGES/DRESSINGS) ×4 IMPLANT
ELECT REM PT RETURN 9FT ADLT (ELECTROSURGICAL) ×3
ELECTRODE REM PT RTRN 9FT ADLT (ELECTROSURGICAL) ×1 IMPLANT
GAUZE SPONGE 4X4 12PLY STRL (GAUZE/BANDAGES/DRESSINGS) ×3 IMPLANT
GLOVE BIO SURGEON STRL SZ7.5 (GLOVE) ×3 IMPLANT
GLOVE BIO SURGEON STRL SZ8 (GLOVE) ×3 IMPLANT
GLOVE BIO SURGEON STRL SZ8.5 (GLOVE) ×3 IMPLANT
GLOVE BIOGEL PI IND STRL 7.5 (GLOVE) ×1 IMPLANT
GLOVE BIOGEL PI IND STRL 8 (GLOVE) ×1 IMPLANT
GLOVE BIOGEL PI INDICATOR 7.5 (GLOVE) ×2
GLOVE BIOGEL PI INDICATOR 8 (GLOVE) ×2
GOWN STRL REUS W/ TWL LRG LVL3 (GOWN DISPOSABLE) ×2 IMPLANT
GOWN STRL REUS W/ TWL XL LVL3 (GOWN DISPOSABLE) ×1 IMPLANT
GOWN STRL REUS W/TWL LRG LVL3 (GOWN DISPOSABLE) ×4
GOWN STRL REUS W/TWL XL LVL3 (GOWN DISPOSABLE) ×2
GUIDEPIN 3.2X17.5 THRD DISP (PIN) ×2 IMPLANT
GUIDEWIRE BALL NOSE 80CM (WIRE) ×2 IMPLANT
KIT BASIN OR (CUSTOM PROCEDURE TRAY) ×3 IMPLANT
KIT TURNOVER KIT B (KITS) ×3 IMPLANT
MANIFOLD NEPTUNE II (INSTRUMENTS) ×3 IMPLANT
NAIL TIBIAL 11MMX36CM (Nail) ×2 IMPLANT
NEEDLE 22X1 1/2 (OR ONLY) (NEEDLE) IMPLANT
NS IRRIG 1000ML POUR BTL (IV SOLUTION) ×3 IMPLANT
PACK ORTHO EXTREMITY (CUSTOM PROCEDURE TRAY) ×3 IMPLANT
PAD ABD 8X10 STRL (GAUZE/BANDAGES/DRESSINGS) ×2 IMPLANT
PAD ARMBOARD 7.5X6 YLW CONV (MISCELLANEOUS) ×4 IMPLANT
PAD CAST 4YDX4 CTTN HI CHSV (CAST SUPPLIES) IMPLANT
PADDING CAST COTTON 4X4 STRL (CAST SUPPLIES) ×2
PADDING CAST COTTON 6X4 STRL (CAST SUPPLIES) ×5 IMPLANT
SCREW ACECAP 38MM (Screw) ×2 IMPLANT
SCREW ACECAP 44MM (Screw) ×2 IMPLANT
SCREW PROXIMAL DEPUY (Screw) ×4 IMPLANT
SCREW PRXML FT 50X5.5XLCK NS (Screw) IMPLANT
SCREW PRXML FT 55X5.5XNS TIB (Screw) IMPLANT
SPONGE LAP 18X18 RF (DISPOSABLE) ×3 IMPLANT
STAPLER VISISTAT 35W (STAPLE) ×3 IMPLANT
STOCKINETTE IMPERVIOUS LG (DRAPES) ×1 IMPLANT
STRIP CLOSURE SKIN 1/2X4 (GAUZE/BANDAGES/DRESSINGS) IMPLANT
SUCTION FRAZIER HANDLE 10FR (MISCELLANEOUS)
SUCTION TUBE FRAZIER 10FR DISP (MISCELLANEOUS) IMPLANT
SUT ETHILON 2 0 PSLX (SUTURE) ×2 IMPLANT
SUT ETHILON 3 0 PS 1 (SUTURE) ×2 IMPLANT
SUT PDS AB 2-0 CT1 27 (SUTURE) IMPLANT
SUT VIC AB 0 CT1 27 (SUTURE) ×2
SUT VIC AB 0 CT1 27XBRD ANBCTR (SUTURE) IMPLANT
SUT VIC AB 1 CT1 27 (SUTURE) ×2
SUT VIC AB 1 CT1 27XBRD ANBCTR (SUTURE) IMPLANT
SUT VIC AB 1 CTX 36 (SUTURE) ×2
SUT VIC AB 1 CTX36XBRD ANBCTR (SUTURE) IMPLANT
SUT VIC AB 2-0 CT1 27 (SUTURE) ×2
SUT VIC AB 2-0 CT1 TAPERPNT 27 (SUTURE) ×1 IMPLANT
SYR CONTROL 10ML LL (SYRINGE) IMPLANT
TOWEL OR 17X24 6PK STRL BLUE (TOWEL DISPOSABLE) ×6 IMPLANT
TOWEL OR 17X26 10 PK STRL BLUE (TOWEL DISPOSABLE) ×6 IMPLANT
TUBE CONNECTING 12'X1/4 (SUCTIONS) ×1
TUBE CONNECTING 12X1/4 (SUCTIONS) ×2 IMPLANT
UNDERPAD 30X30 (UNDERPADS AND DIAPERS) ×3 IMPLANT
WATER STERILE IRR 1000ML POUR (IV SOLUTION) ×6 IMPLANT
YANKAUER SUCT BULB TIP NO VENT (SUCTIONS) ×3 IMPLANT

## 2019-03-28 NOTE — Anesthesia Postprocedure Evaluation (Signed)
Anesthesia Post Note  Patient: NATHAN PINSON  Procedure(s) Performed: INTRAMEDULLARY (IM) NAIL RIGHT TIBIAL (Right Leg Lower) HARDWARE REMOVAL RIGHT TIBIA (Right Leg Lower)     Patient location during evaluation: PACU Anesthesia Type: General Level of consciousness: sedated, patient cooperative and oriented Pain management: pain level controlled Vital Signs Assessment: post-procedure vital signs reviewed and stable Respiratory status: spontaneous breathing, nonlabored ventilation and respiratory function stable Cardiovascular status: blood pressure returned to baseline and stable Postop Assessment: no apparent nausea or vomiting Anesthetic complications: no    Last Vitals:  Vitals:   03/28/19 1200 03/28/19 1221  BP:  (!) 144/93  Pulse: 64 64  Resp: 13 20  Temp: 36.4 C 37.1 C  SpO2: 99% 97%    Last Pain:  Vitals:   03/28/19 1228  TempSrc:   PainSc: Asleep                 Luticia Tadros,E. Deandra Gadson

## 2019-03-28 NOTE — Transfer of Care (Signed)
Immediate Anesthesia Transfer of Care Note  Patient: Duane Collins  Procedure(s) Performed: INTRAMEDULLARY (IM) NAIL RIGHT TIBIAL (Right Leg Lower) HARDWARE REMOVAL RIGHT TIBIA (Right Leg Lower)  Patient Location: PACU  Anesthesia Type:General  Level of Consciousness: awake, alert  and oriented  Airway & Oxygen Therapy: Patient Spontanous Breathing and Patient connected to nasal cannula oxygen  Post-op Assessment: Report given to RN, Post -op Vital signs reviewed and stable and Patient moving all extremities  Post vital signs: Reviewed and stable  Last Vitals:  Vitals Value Taken Time  BP 153/91 03/28/2019 10:35 AM  Temp    Pulse 65 03/28/2019 10:36 AM  Resp 18 03/28/2019 10:36 AM  SpO2 98 % 03/28/2019 10:36 AM  Vitals shown include unvalidated device data.  Last Pain:  Vitals:   03/28/19 0602  TempSrc:   PainSc: 4       Patients Stated Pain Goal: 2 (03/28/19 0602)  Complications: No apparent anesthesia complications

## 2019-03-28 NOTE — Evaluation (Signed)
Physical Therapy Evaluation Patient Details Name: Duane Collins MRN: [id number] DOB: [birth year] Today's Date: 03/28/2019   History of Present Illness  Pt is 58 yo male with PMH: HTN, DM who had a R tib fx in 9/19  with IM nail and returns with persistent nonunion, hypertrophic. Labs negative for infection.  Clinical Impression  Pt admitted with above diagnosis. Pt currently with functional limitations due to the deficits listed below (see PT Problem List). Pt very lethargic on eval. Min A to sit EOB and perform sit <> stand. Did not transfer to chair due to lethargy. Able to take small steps to [facility] with RW.  Pt will benefit from skilled PT to increase their independence and safety with mobility to allow discharge to the venue listed below.       Follow Up Recommendations No PT follow up    Equipment Recommendations  None recommended by PT    Recommendations for Other Services       Precautions / Restrictions Precautions Precautions: Fall Restrictions Weight Bearing Restrictions: Yes RLE Weight Bearing: Weight bearing as tolerated      Mobility  Bed Mobility Overal bed mobility: Needs Assistance Bed Mobility: Supine to Sit;Sit to Supine     Supine to sit: Min assist Sit to supine: Min assist   General bed mobility comments: min A to RLE for pain control in and out of bed  Transfers Overall transfer level: Needs assistance Equipment used: Rolling walker (2 wheeled) Transfers: Sit to/from Stand Sit to Stand: Min assist;From elevated surface         General transfer comment: min A for power up, mostly due to pt being so sleepy  Ambulation/Gait Ambulation/Gait assistance: Min assist Gait Distance (Feet): 2 Feet Assistive device: Rolling walker (2 wheeled) Gait Pattern/deviations: Step-to pattern     General Gait Details: small sidesteps to [facility]  Stairs            Wheelchair Mobility    Modified Rankin (Stroke Patients Only)       Balance Overall  balance assessment: Mild deficits observed, not formally tested                                           Pertinent Vitals/Pain Pain Assessment: Faces Faces Pain Scale: Hurts even more Pain Location: RLE Pain Descriptors / Indicators: Operative site guarding Pain Intervention(s): Limited activity within patient's tolerance;Monitored during session    Home Living Family/patient expects to be discharged to:: Private residence Living Arrangements: Spouse/significant other Available Help at Discharge: Family;Available 24 hours/day Type of Home: House Home Access: Stairs to enter   [employer] of Steps: 6 w/ rail in front (3 no rail in garage) Home Layout: One level Home Equipment: Walker - 2 wheels;Crutches;Bedside commode;Wheelchair - manual      Prior Function Level of Independence: Independent         Comments: hasn't been able to work since injury     Higher education careers adviser        Extremity/Trunk Assessment   Upper Extremity Assessment Upper Extremity Assessment: Overall WFL for tasks assessed    Lower Extremity Assessment Lower Extremity Assessment: RLE deficits/detail RLE Deficits / Details: able to move ankles and toes, hip flex >3/5 RLE: Unable to fully assess due to pain RLE Sensation: WNL RLE Coordination: WNL    Cervical / Trunk Assessment Cervical / Trunk Assessment: Normal  Communication  Communication: No difficulties  Cognition Arousal/Alertness: Lethargic;Suspect due to medications Behavior During Therapy: [facility]WFL for tasks assessed/performed Overall Cognitive Status: Within Functional Limits for tasks assessed                                        General Comments General comments (skin integrity, edema, etc.): pt's dressing soaked through to bed, RN notified and dressing reinforced and linens changed    Exercises General Exercises - Lower Extremity Ankle Circles/Pumps: AROM;Right;10 reps;Supine    Assessment/Plan    PT Assessment Patient needs continued PT services  PT Problem List Decreased mobility;Decreased knowledge of precautions;Pain;Decreased balance;Decreased activity tolerance       PT Treatment Interventions DME instruction;Gait training;Stair training;Functional mobility training;Therapeutic activities;Therapeutic exercise;Balance training;Patient/family education    PT Goals (Current goals can be found in the Care Plan section)  Acute Rehab PT Goals Patient Stated Goal: return home PT Goal Formulation: With patient Time For Goal Achievement: 04/11/19 Potential to Achieve Goals: Good    Frequency Min 5X/week   Barriers to discharge        Co-evaluation               AM-PAC PT "6 Clicks" Mobility  Outcome Measure Help needed turning from your back to your side while in a flat bed without using bedrails?: A Little Help needed moving from lying on your back to sitting on the side of a flat bed without using bedrails?: A Little Help needed moving to and from a bed to a chair (including a wheelchair)?: A Little Help needed standing up from a chair using your arms (e.g., wheelchair or bedside chair)?: A Little Help needed to walk in hospital room?: A Little Help needed climbing 3-5 steps with a railing? : A Lot 6 Click Score: 17    End of Session   Activity Tolerance: Patient tolerated treatment well Patient left: in bed;with call bell/phone within reach;with nursing/sitter in room Nurse Communication: Mobility status PT Visit Diagnosis: Unsteadiness on feet (R26.81);Pain;Difficulty in walking, not elsewhere classified (R26.2) Pain - Right/Left: Right Pain - part of body: Leg    Time: 1425-1445 PT Time Calculation (min) (ACUTE ONLY): 20 min   Charges:   PT Evaluation $PT Eval Low Complexity: 1 Low          Lyanne CoVictoria Maecyn Panning, PT  Acute Rehab Services  Pager [phone] Office [phone]   Lawana ChambersVictoria L Ashlinn Hemrick 03/28/2019, 3:00 PM

## 2019-03-28 NOTE — Progress Notes (Signed)
Orthopedic Tech Progress Note Patient Details:  Duane Collins 05-20-1961 [id number]  Ortho Devices Ortho Device/Splint Location: Trapeze bar Ortho Device/Splint Interventions: Application   Post Interventions Patient Tolerated: Well Instructions Provided: Care of device   Saul Fordyce 03/28/2019, 2:33 PM

## 2019-03-28 NOTE — H&P (Signed)
Orthopaedic Trauma Service H&P  Duane Collins is an 58 y.o. male.  HPI: open segmental tib s/p ORIF and IMN with persistent nonunion, hypertrophic. Labs negative for infection.  Past Medical History:  Diagnosis Date  . Anxiety   . Arthritis knee  . Diabetes mellitus without complication (HCC)    Type II  . High cholesterol   . Hypertension   . Tibia/fibula fracture, shaft, right, open type I segmental 07/19/2018    Past Surgical History:  Procedure Laterality Date  . APPENDECTOMY    . KNEE ARTHROSCOPY Right    Menisus tear  . KNEE ARTHROSCOPY Left    "cleaned"  . TIBIA IM NAIL INSERTION Right 07/19/2018   Procedure: INTRAMEDULLARY (IM) NAIL TIBIAL;  Surgeon: Teryl Lucy, MD;  Location: [facility] OR;  Service: Orthopedics;  Laterality: Right;    History reviewed. No pertinent family history.  Social History:  reports that he quit smoking about 20 years ago. He quit after 5.00 years of use. He has never used smokeless tobacco. He reports previous alcohol use. He reports previous drug use.  Allergies: No Known Allergies  Medications: I have reviewed the patient's current medications.  Results for orders placed or performed during the hospital encounter of 03/28/19 (from the past 48 hour(s))  Glucose, capillary     Status: Abnormal   Collection Time: 03/28/19  5:56 AM  Result Value Ref Range   Glucose-Capillary 102 (H) 70 - 99 mg/dL   Comment 1 Notify RN    Comment 2 Document in Chart   CBC WITH DIFFERENTIAL     Status: Abnormal   Collection Time: 03/28/19  6:28 AM  Result Value Ref Range   WBC 5.6 4.0 - 10.5 K/uL   RBC 3.97 (L) 4.22 - 5.81 MIL/uL   Hemoglobin 12.6 (L) 13.0 - 17.0 g/dL   HCT 81.8 29.9 - 37.1 %   MCV 99.2 80.0 - 100.0 fL   MCH 31.7 26.0 - 34.0 pg   MCHC 32.0 30.0 - 36.0 g/dL   RDW 69.6 78.9 - 38.1 %   Platelets 273 150 - 400 K/uL   nRBC 0.0 0.0 - 0.2 %   Neutrophils Relative % 38 %   Neutro Abs 2.1 1.7 - 7.7 K/uL   Lymphocytes Relative 50 %   Lymphs  Abs 2.7 0.7 - 4.0 K/uL   Monocytes Relative 11 %   Monocytes Absolute 0.6 0.1 - 1.0 K/uL   Eosinophils Relative 1 %   Eosinophils Absolute 0.1 0.0 - 0.5 K/uL   Basophils Relative 0 %   Basophils Absolute 0.0 0.0 - 0.1 K/uL   Immature Granulocytes 0 %   Abs Immature Granulocytes 0.01 0.00 - 0.07 K/uL    Comment: Performed at [facility], [address] [place], [place] [address]  Comprehensive metabolic panel     Status: None   Collection Time: 03/28/19  6:28 AM  Result Value Ref Range   Sodium 139 135 - 145 mmol/L   Potassium 4.0 3.5 - 5.1 mmol/L   Chloride 106 98 - 111 mmol/L   CO2 23 22 - 32 mmol/L   Glucose, Bld 96 70 - 99 mg/dL   BUN 14 6 - 20 mg/dL   Creatinine, Ser 0.25 0.61 - 1.24 mg/dL   Calcium 9.0 8.9 - 85.2 mg/dL   Total Protein 6.7 6.5 - 8.1 g/dL   Albumin 3.8 3.5 - 5.0 g/dL   AST 18 15 - 41 U/L   ALT 16 0 - 44  U/L   Alkaline Phosphatase 105 38 - 126 U/L   Total Bilirubin 0.5 0.3 - 1.2 mg/dL   GFR calc non Af Amer >60 >60 mL/min   GFR calc Af Amer >60 >60 mL/min   Anion gap 10 5 - 15    Comment: Performed at [facility], [address] [place], [place] [address]  Protime-INR     Status: None   Collection Time: 03/28/19  6:28 AM  Result Value Ref Range   Prothrombin Time 13.1 11.4 - 15.2 seconds   INR 1.0 0.8 - 1.2    Comment: (NOTE) INR goal varies based on device and disease states. Performed at [facility], [address] [place], [place] [address]   APTT     Status: None   Collection Time: 03/28/19  6:28 AM  Result Value Ref Range   aPTT 31 24 - 36 seconds    Comment: Performed at [facility], [address] [place], [place] [address]    No results found.  ROS No recent fever, bleeding abnormalities, urologic dysfunction, GI problems, or weight gain.  Blood pressure (!) 153/75, pulse (!) 54, temperature 98.3 F (36.8 C), temperature source Oral, resp. rate 18, height 5\' 9"  (1.753 m), weight 104.3 kg, SpO2 99  %. Physical Exam NCAT RRR CTA S/NT/ND RLE Well healed incisions  Nontender proximal site but painful distal metadiaphyseal fracture site  No knee or ankle effusion  Knee stable to varus/ valgus and anterior/posterior stress  Sens DPN, SPN, TN intact  Motor EHL, ext, flex, evers 5/5  DP 2+, PT 2+, No significant edema  Assessment/Plan: Removal of current 9mm nail and probable exchange We will send intraoperative cultures and convert to antibiotic nail if any question of infection  I discussed with the patient the risks and benefits of surgery, including the possibility of infection, nerve injury, vessel injury, wound breakdown, arthritis, symptomatic hardware, DVT/ PE, loss of motion, malunion, nonunion, and need for further surgery among others.  We also specifically discussed the possibility of antibiotic nail if sign of infection and anticipated need for later removal.  He acknowledged these risks and wished to proceed.   Myrene GalasMichael Dezerae Freiberger, MD [facility], [facility] [phone]  03/28/2019  7:55 AM

## 2019-03-28 NOTE — Op Note (Signed)
03/28/2019  10:33 AM  PATIENT:  Duane Collins  58 y.o. male  PRE-OPERATIVE DIAGNOSIS:  RIGHT TIBIA NONUNION s/p SEGMENTAL GRADE 2 OPEN FRACTURE, LOOSE HARDWARE  POST-OPERATIVE DIAGNOSIS:  RIGHT TIBIA NONUNION s/p SEGMENTAL GRADE 2 OPEN FRACTURE, LOOSE HARDWARE  PROCEDURE:  Procedure(s): 1. INTRAMEDULLARY (IM) NAIL RIGHT TIBIAL (Right) 11 MM X 360 MM STATICALLY LOCKED FOR NONUNION 2. HARDWARE REMOVAL RIGHT TIBIA (Right)  SURGEON:  Surgeon(s) and Role:    Myrene Galas* Darcel Frane, MD - Primary  PHYSICIAN ASSISTANT: Montez MoritaKEITH PAUL, PA-C  ANESTHESIA:   general  EBL:  50 mL   BLOOD ADMINISTERED:none  DRAINS: none   LOCAL MEDICATIONS USED:  NONE  SPECIMEN:  Source of Specimen:  REAMINGS  DISPOSITION OF SPECIMEN:  MICRO  COUNTS:  YES  TOURNIQUET:  * No tourniquets in log *  DICTATION: .Note written in EPIC  PLAN OF CARE: Admit to inpatient   PATIENT DISPOSITION:  PACU - hemodynamically stable.   Delay start of Pharmacological VTE agent (>24hrs) due to surgical blood loss or risk of bleeding: NO  BRIEF SUMMARY AND INDICATION FOR PROCEDURE:   Duane Collins is a 58 y.o.-year- old who sustained severe right leg injuries in forklift accident, including open right segmental tibia fracture. The patient required splinting, elevation, and a period of soft tissue swelling resolution before safely undergoing surgical repair.  We did discuss the risks and benefits of surgical reconstruction including the possibility of infection, nerve injury, vessel injury, malunion, persistent nonunion, DVT, PE, heart attack, stroke, loss of motion, symptomatic hardware, need for further surgery, and others.   The patient acknowledged these risks and wished to proceed.   BRIEF SUMMARY OF PROCEDURE:  After administration of preoperative antibiotic, the patient was taken to the operating room.  Following a chlorhexidine wash, the right lower extremity was prepped with betadine scrub and paint and then  draped in usual sterile fashion.  No tourniquet was used during the procedure. The radiolucent triangle brought in.  The knee was bent up to 90 degrees and a 3 cm incision was made through the old anterior knee incisional scar.  In the deep tissues I went through the old patellar tendon split performed by the prior surgeon. A guidewire was advanced into the canal of the nail. The distal screws were removed through the old medial incision. The two proximal locks were then removed, with the exception that the lateral to medial screw was broken at the junction of the nail. We were able to tap this screw out of the nail by one mm and rotate the nail so that we could free it sufficiently for removal. A ball-tipped guidewire was bent and then advanced into the proximal tibia and then across the fracture site, watching on orthogonal views until it was centered in the distal plafond. We did not encounter any purulence, unexpected fluid collection, or other sign of infection. While maintaining fracture reduction, the tibia was then reamed sequentially from 9 to 12mm. We collected tibial reamings from 9 through 11 mm, thoroughly irrigated the tibial canal, then continued reaming for 11.5 and 12 mm and placing an 11 mm x 360 mm, statically locked nail. We did use the Versanail system which allowed for an alternate placement of proximal locks off the jig and perfect circle technique to place new new sites and incisions for the distal locking bolts, as well. All screws were checked for position within the nail and length.  Laboratory analysis remains pending at this time.  Mellody DanceKeith  Renae Fickle, PA-C, assisted me throughout, and his assistance was necessary as I stabilized the nonunion site while he reamed and placed the Nail, and then I placed the locking bolts. Wounds irrigated thoroughly, closed in standard layered fashion using 0 Vicryl, 2-0 Vicryl, 3-0 nylon.  Sterile gently compressive dressing were applied.  The patient was  then taken to the PACU in stable condition.   PROGNOSIS:  Patient will be weightbearing as tolerated on the right lower extremity with unrestricted range of motion of the knee and ankle.  Will anticipate removing the sutures, re-evaluate in 10 to 14 days, at which time, he will likely continue in CAM boot. He will be on a formal pharmacologic DVT prophylaxis with Lovenox.  Myrene Galas, MD [facility] [phone] (p)

## 2019-03-28 NOTE — Anesthesia Procedure Notes (Signed)
Procedure Name: Intubation Date/Time: 03/28/2019 8:19 AM Performed by: Leonor Liv, CRNA Pre-anesthesia Checklist: Patient identified, Emergency Drugs available, Suction available and Patient being monitored Patient Re-evaluated:Patient Re-evaluated prior to induction Oxygen Delivery Method: Circle System Utilized Preoxygenation: Pre-oxygenation with 100% oxygen Induction Type: IV induction Ventilation: Mask ventilation without difficulty Laryngoscope Size: Mac and 4 Grade View: Grade II Tube type: Oral Tube size: 7.5 mm Number of attempts: 1 Airway Equipment and Method: Stylet and Oral airway Placement Confirmation: ETT inserted through vocal cords under direct vision,  positive ETCO2 and breath sounds checked- equal and bilateral Secured at: 23 cm Tube secured with: Tape Dental Injury: Teeth and Oropharynx as per pre-operative assessment

## 2019-03-29 ENCOUNTER — Encounter (HOSPITAL_COMMUNITY): Payer: Self-pay | Admitting: Orthopedic Surgery

## 2019-03-29 DIAGNOSIS — I10 Essential (primary) hypertension: Secondary | ICD-10-CM | POA: Diagnosis present

## 2019-03-29 DIAGNOSIS — E559 Vitamin D deficiency, unspecified: Secondary | ICD-10-CM | POA: Diagnosis present

## 2019-03-29 DIAGNOSIS — S82201M Unspecified fracture of shaft of right tibia, subsequent encounter for open fracture type I or II with nonunion: Secondary | ICD-10-CM | POA: Diagnosis not present

## 2019-03-29 DIAGNOSIS — E119 Type 2 diabetes mellitus without complications: Secondary | ICD-10-CM

## 2019-03-29 DIAGNOSIS — F419 Anxiety disorder, unspecified: Secondary | ICD-10-CM | POA: Diagnosis present

## 2019-03-29 LAB — BASIC METABOLIC PANEL
Anion gap: 8 (ref 5–15)
BUN: 16 mg/dL (ref 6–20)
CO2: 26 mmol/L (ref 22–32)
Calcium: 8.8 mg/dL — ABNORMAL LOW (ref 8.9–10.3)
Chloride: 101 mmol/L (ref 98–111)
Creatinine, Ser: 0.97 mg/dL (ref 0.61–1.24)
GFR calc Af Amer: >60 mL/min (ref >60)
GFR calc non Af Amer: >60 mL/min (ref >60)
Glucose, Bld: 169 mg/dL — ABNORMAL HIGH (ref 70–99)
Potassium: 4.7 mmol/L (ref 3.5–5.1)
Sodium: 135 mmol/L (ref 135–145)

## 2019-03-29 LAB — VITAMIN D 25 HYDROXY (VIT D DEFICIENCY, FRACTURES): Vit D, 25-Hydroxy: 20.2 ng/mL — ABNORMAL LOW (ref 30.0–100.0)

## 2019-03-29 LAB — CALCITRIOL (1,25 DI-OH VIT D): Vit D, 1,25-Dihydroxy: 49.3 pg/mL (ref 19.9–79.3)

## 2019-03-29 LAB — GLUCOSE, CAPILLARY
Glucose-Capillary: 128 mg/dL — ABNORMAL HIGH (ref 70–99)
Glucose-Capillary: 144 mg/dL — ABNORMAL HIGH (ref 70–99)
Glucose-Capillary: 134 mg/dL — ABNORMAL HIGH (ref 70–99)

## 2019-03-29 MED ORDER — CARVEDILOL 3.125 MG PO TABS
3.1250 mg | ORAL_TABLET | Freq: Two times a day (BID) | ORAL | Status: DC
  Administered 2019-03-29 – 2019-03-30 (×3): 3.125 mg via ORAL
  Filled 2019-03-29 (×3): qty 1

## 2019-03-29 MED ORDER — VITAMIN D 25 MCG (1000 UNIT) PO TABS
2000.0000 [IU] | ORAL_TABLET | Freq: Two times a day (BID) | ORAL | Status: DC
  Administered 2019-03-29 – 2019-03-30 (×2): 2000 [IU] via ORAL
  Filled 2019-03-29 (×3): qty 2

## 2019-03-29 MED ORDER — CITALOPRAM HYDROBROMIDE 40 MG PO TABS
40.0000 mg | ORAL_TABLET | Freq: Every day | ORAL | Status: DC
  Administered 2019-03-29 – 2019-03-30 (×2): 40 mg via ORAL
  Filled 2019-03-29 (×2): qty 1

## 2019-03-29 MED ORDER — INSULIN ASPART 100 UNIT/ML ~~LOC~~ SOLN
0.0000 [IU] | Freq: Three times a day (TID) | SUBCUTANEOUS | Status: DC
  Administered 2019-03-29 (×2): 2 [IU] via SUBCUTANEOUS

## 2019-03-29 MED ORDER — METFORMIN HCL 500 MG PO TABS
1000.0000 mg | ORAL_TABLET | Freq: Two times a day (BID) | ORAL | Status: DC
  Administered 2019-03-29 – 2019-03-30 (×3): 1000 mg via ORAL
  Filled 2019-03-29 (×3): qty 2

## 2019-03-29 MED ORDER — SIMVASTATIN 20 MG PO TABS
40.0000 mg | ORAL_TABLET | Freq: Every evening | ORAL | Status: DC
  Administered 2019-03-29: 40 mg via ORAL
  Filled 2019-03-29: qty 2

## 2019-03-29 MED ORDER — VITAMIN C 500 MG PO TABS
500.0000 mg | ORAL_TABLET | Freq: Every day | ORAL | Status: DC
  Administered 2019-03-29 – 2019-03-30 (×2): 500 mg via ORAL
  Filled 2019-03-29 (×2): qty 1

## 2019-03-29 MED ORDER — VITAMIN D (ERGOCALCIFEROL) 1.25 MG (50000 UNIT) PO CAPS: 50000.0000 [IU] | ORAL_CAPSULE | ORAL | Status: DC

## 2019-03-29 NOTE — Progress Notes (Signed)
Physical Therapy Treatment Patient Details Name: Duane PiliDennis R Pirie MRN: [id number] DOB: 1961/01/03 Today's Date: 03/29/2019    History of Present Illness Pt is a 58 y.o. 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 Labs negative for infection. Now s/p hardware removal and R tibia IM nail 5/26. PMH includes HTN, DM.   PT Comments    Pt progressing well with mobility. Ambulating with RW at supervision-level; able to increase WBAT through RLE with cues. Educ re: RLE therex to encourage R knee ROM, importance of mobility. Pt owns necessary equipment and will have assist upon return home. If to remain admitted, will continue to follow acutely.   Follow Up Recommendations  No PT follow up     Equipment Recommendations  None recommended by PT    Recommendations for Other Services       Precautions / Restrictions Precautions Precautions: Fall Restrictions Weight Bearing Restrictions: Yes RLE Weight Bearing: Weight bearing as tolerated    Mobility  Bed Mobility Overal bed mobility: Modified Independent Bed Mobility: Supine to Sit     Supine to sit: Modified independent (Device/Increase time);HOB elevated     General bed mobility comments: Mod indep to use LLE to assist RLE to EOB  Transfers Overall transfer level: Modified independent Equipment used: Rolling walker (2 wheeled) Transfers: Sit to/from Stand Sit to Stand: Modified independent (Device/Increase time)         General transfer comment: Reliant on momentum to power into standing  Ambulation/Gait Ambulation/Gait assistance: Supervision Gait Distance (Feet): 112 Feet Assistive device: Rolling walker (2 wheeled) Gait Pattern/deviations: Step-through pattern;Decreased stride length;Decreased weight shift to right Gait velocity: Decreased Gait velocity interpretation: 1.31 - 2.62 ft/sec, indicative of limited community ambulator General Gait  Details: Slow, steady gait with RW; supervision for safety. Intermittent cues for increased WBAT through RLE   Stairs Stairs: (Pt declined; reports familiar with ascending 2 steps using RW; reviewed safest technique)           Wheelchair Mobility    Modified Rankin (Stroke Patients Only)       Balance Overall balance assessment: Needs assistance   Sitting balance-Leahy Scale: Good       Standing balance-Leahy Scale: Fair Standing balance comment: Can static stand without UE support                            Cognition Arousal/Alertness: Awake/alert Behavior During Therapy: WFL for tasks assessed/performed Overall Cognitive Status: Within Functional Limits for tasks assessed                                        Exercises Other Exercises Other Exercises: Encouraged seated knee flex, LAQ, supine heel slides -- anything to work on R knee ROM    General Comments        Pertinent Vitals/Pain Pain Assessment: 0-10 Pain Score: 10-Worst pain ever Pain Location: RLE Pain Descriptors / Indicators: Guarding;Grimacing;Discomfort Pain Intervention(s): Premedicated before session;Limited activity within patient's tolerance;Repositioned    Home Living                      Prior Function            PT Goals (current goals can now be found in the care plan section) Acute Rehab PT Goals Patient Stated  Goal: return home PT Goal Formulation: With patient Time For Goal Achievement: 04/11/19 Potential to Achieve Goals: Good Progress towards PT goals: Progressing toward goals    Frequency    Min 5X/week      PT Plan Current plan remains appropriate    Co-evaluation              AM-PAC PT "6 Clicks" Mobility   Outcome Measure  Help needed turning from your back to your side while in a flat bed without using bedrails?: None Help needed moving from lying on your back to sitting on the side of a flat bed without using  bedrails?: None Help needed moving to and from a bed to a chair (including a wheelchair)?: None Help needed standing up from a chair using your arms (e.g., wheelchair or bedside chair)?: None Help needed to walk in hospital room?: A Little Help needed climbing 3-5 steps with a railing? : A Little 6 Click Score: 22    End of Session Equipment Utilized During Treatment: Gait belt Activity Tolerance: Patient tolerated treatment well Patient left: in chair;with call bell/phone within reach Nurse Communication: Mobility status PT Visit Diagnosis: Unsteadiness on feet (R26.81);Pain;Difficulty in walking, not elsewhere classified (R26.2) Pain - Right/Left: Right Pain - part of body: Leg     Time: 4818-5631 PT Time Calculation (min) (ACUTE ONLY): 20 min  Charges:  $Gait Training: 8-22 mins                    Ina Homes, PT, DPT Acute Rehabilitation Services  Pager [phone] Office [phone]  Malachy Chamber 03/29/2019, 1:15 PM

## 2019-03-29 NOTE — Evaluation (Signed)
Occupational Therapy Evaluation Patient Details Name: Duane Collins MRN: [id number] DOB: Jun 12, 1961 Today's Date: 03/29/2019    History of Present Illness Pt is a 58 y.o. 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 Labs negative for infection. Now s/p hardware removal and R tibia IM nail 5/26. PMH includes HTN, DM.   Clinical Impression   Pt with decline in function and safety with ADLs and ADL mobility with decreased balance and endurance. Pt lives at home independently with his wife and currently requires min A - min guard A with LB ADLs and clothing mgt/toileting and sup - Mod I with ADL transfers using RW. Pt would benefit from acute OT services to address impairments to maximize level of function and safety    Follow Up Recommendations  No OT follow up    Equipment Recommendations  None recommended by OT    Recommendations for Other Services       Precautions / Restrictions Precautions Precautions: Fall Restrictions Weight Bearing Restrictions: Yes RLE Weight Bearing: Weight bearing as tolerated      Mobility Bed Mobility Overal bed mobility: Modified Independent Bed Mobility: Supine to Sit     Supine to sit: Modified independent (Device/Increase time);HOB elevated     General bed mobility comments: pt in recliner upon arrival  Transfers Overall transfer level: Modified independent Equipment used: Rolling walker (2 wheeled) Transfers: Sit to/from Stand Sit to Stand: Supervision;Modified independent (Device/Increase time)         General transfer comment: Reliant on momentum to power into standing    Balance Overall balance assessment: Needs assistance Sitting-balance support: No upper extremity supported;Feet supported Sitting balance-Leahy Scale: Good     Standing balance support: Single extremity supported;Bilateral upper extremity supported;During functional  activity Standing balance-Leahy Scale: Fair Standing balance comment: Can static stand without UE support                           ADL either performed or assessed with clinical judgement   ADL Overall ADL's : Needs assistance/impaired Eating/Feeding: Independent;Sitting   Grooming: Wash/dry hands;Wash/dry face;Standing;Supervision/safety   Upper Body Bathing: Set up;Sitting   Lower Body Bathing: Minimal assistance   Upper Body Dressing : Set up;Sitting   Lower Body Dressing: Minimal assistance   Toilet Transfer: Supervision/safety;Modified Independent;Ambulation;RW;Grab bars   Toileting- Clothing Manipulation and Hygiene: Min guard   Tub/ Shower Transfer: Supervision/safety;Rolling walker;Ambulation;3 in 1   Functional mobility during ADLs: Supervision/safety;Modified independent       Vision Patient Visual Report: No change from baseline       Perception     Praxis      Pertinent Vitals/Pain Pain Assessment: 0-10 Pain Score: 5  Pain Location: RLE Pain Descriptors / Indicators: Guarding;Grimacing;Discomfort Pain Intervention(s): Premedicated before session;Limited activity within patient's tolerance;Monitored during session;Repositioned     Hand Dominance Right   Extremity/Trunk Assessment Upper Extremity Assessment Upper Extremity Assessment: Overall WFL for tasks assessed   Lower Extremity Assessment Lower Extremity Assessment: Defer to PT evaluation   Cervical / Trunk Assessment Cervical / Trunk Assessment: Normal   Communication Communication Communication: No difficulties   Cognition Arousal/Alertness: Awake/alert Behavior During Therapy: WFL for tasks assessed/performed Overall Cognitive Status: Within Functional Limits for tasks assessed  General Comments       Exercises Other Exercises Other Exercises: Encouraged seated knee flex, LAQ, supine heel slides -- anything to work on  R knee ROM   Shoulder Instructions      Home Living Family/patient expects to be discharged to:: Private residence Living Arrangements: Spouse/significant other Available Help at Discharge: Family;Available 24 hours/day Type of Home: House Home Access: Stairs to enter Entergy CorporationEntrance Stairs-Number of Steps: 6 w/ rail in front (3 no rail in garage)   Home Layout: One level     Bathroom Shower/Tub: Tub/shower unit;Walk-in shower;Door   Foot LockerBathroom Toilet: Standard     Home Equipment: Environmental consultantWalker - 2 wheels;Crutches;Bedside commode;Wheelchair - manual          Prior Functioning/Environment Level of Independence: Independent        Comments: hasn't been able to work since injury        OT Problem List: Decreased activity tolerance;Impaired balance (sitting and/or standing);Pain      OT Treatment/Interventions: Self-care/ADL training;DME and/or AE instruction;Therapeutic activities;Patient/family education    OT Goals(Current goals can be found in the care plan section) Acute Rehab OT Goals Patient Stated Goal: return home OT Goal Formulation: With patient Time For Goal Achievement: 04/05/19 ADL Goals Pt Will Perform Lower Body Bathing: with min guard assist;with supervision;with set-up;with caregiver independent in assisting Pt Will Perform Lower Body Dressing: with min guard assist;with supervision;with set-up;with caregiver independent in assisting Pt Will Transfer to Toilet: with modified independence;ambulating Pt Will Perform Toileting - Clothing Manipulation and hygiene: with supervision;with modified independence;sitting/lateral leans;with caregiver independent in assisting Pt Will Perform Tub/Shower Transfer: with modified independence;ambulating;rolling walker  OT Frequency: Min 2X/week   Barriers to D/C:    no barriers       Co-evaluation              AM-PAC OT "6 Clicks" Daily Activity     Outcome Measure Help from another person eating meals?: None Help  from another person taking care of personal grooming?: None Help from another person toileting, which includes using toliet, bedpan, or urinal?: A Little Help from another person bathing (including washing, rinsing, drying)?: A Little Help from another person to put on and taking off regular upper body clothing?: None Help from another person to put on and taking off regular lower body clothing?: A Little 6 Click Score: 21   End of Session Equipment Utilized During Treatment: Gait belt;Rolling walker;Other (comment)(3 in 1)  Activity Tolerance: Patient tolerated treatment well Patient left: in chair;with call bell/phone within reach  OT Visit Diagnosis: Other abnormalities of gait and mobility (R26.89);Pain Pain - Right/Left: Right Pain - part of body: Leg                Time: 1140-1201 OT Time Calculation (min): 21 min Charges:  OT General Charges $OT Visit: 1 Visit OT Evaluation $OT Eval Low Complexity: 1 Low    Galen ManilaSpencer, Nupur Hohman Jeanette 03/29/2019, 2:37 PM

## 2019-03-29 NOTE — Progress Notes (Addendum)
Inpatient Diabetes Program Recommendations  AACE/ADA: New Consensus Statement on Inpatient Glycemic Control (2015)  Target Ranges:  Prepandial:   less than 140 mg/dL      Peak postprandial:   less than 180 mg/dL (1-2 hours)      Critically ill patients:  140 - 180 mg/dL   Results for Duane Collins, Duane Collins (MRN [id number]) as of 03/29/2019 08:54  Ref. Range 03/28/2019 05:56 03/28/2019 10:36  Glucose-Capillary Latest Ref Range: 70 - 99 mg/dL 326 (H) 712 (H)   Results for Duane Collins, Duane Collins (MRN [id number]) as of 03/29/2019 08:54  Ref. Range 03/28/2019 06:28  Hemoglobin A1C Latest Ref Range: 4.8 - 5.6 % 6.9 (H)  (151 mg/dl)     Admit with: Right open segmental tib s/p ORIF and IMN with persistent nonunion  History: DM  Home DM Meds: Metformin 1000 mg BID       Farxiga 10 mg daily  Current Orders: Novolog Moderate Correction Scale/ SSI (0-15 units) TID AC      Metformin 1000 mg BID      Note Novolog SSi to start at 12pm today.  Current A1c of 6.9% shows good CBG control at home.  Noted referral for DM education--Plan to speak with patient today to discuss home DM regimen.    Addendum 2pm- Spoke with pt today about his home DM regimen.  Pt told me he has been under a lot of stress and having pain.  Also has not been as active as he normally is at home due to his condition.  Takes DM meds everyday as directed.   Discussed current A1c of 6.9% with pt.  Goal A1c per ADA guidelines is 7% or less so pt is currently meeting goal A1c.  Checks CBGs BID at home.  Encouraged pt to record CBGs if they become consistently elevated.  Has PCP Clelia Croft, NP with [facility]).  Plans to follow up with PCP.  Did not have questions for me regarding his diabetes care.     --Will follow patient during hospitalization--  Ambrose Finland RN, MSN, CDE Diabetes Coordinator Inpatient Glycemic Control Team Team Pager: [phone] (8a-5p)

## 2019-03-29 NOTE — TOC Initial Note (Addendum)
Transition of Care [facility]) - Initial/Assessment Note    Patient Details  Name: Duane Collins MRN: [id number] Date of Birth: 04-04-1961  Transition of Care [facility]) CM/SW Contact:    Kingsley Plan, RN Phone Number: 03/29/2019, 11:41 AM  Clinical Narrative:                 Confirmed with patient PCP Dr Clelia Croft  Patient's address is [address], [place] [address] phone [phone]  Patient from home with wife. Patient has 3 in 1 and walker at home already from previous surgery 07/2018.  Patient provided Workers Comp information as Financial trader [phone] with [employer]. Called same spoke with Mrs Domenica Reamer she is not assigned to patinet's case, she only attended one MD appointment with patient. Mrs Domenica Reamer provided Elane Fritz an adjustor with [employer] [phone] information. Called same left voice mail , awaiting call back. Elane Fritz returned call she is the insurance adjustor , does not need any information until his next office visit. If he needs to contact workers comp Ms Lequita Halt stated patient would need to call his attorney who would than call her. Patient aware.   Expected Discharge Plan: Home/Self Care Barriers to Discharge: Continued Medical Work up   Patient Goals and CMS Choice Patient states their goals for this hospitalization and ongoing recovery are:: to go home  CMS Medicare.gov Compare Post Acute Care list provided to:: Patient Choice offered to / list presented to : NA  Expected Discharge Plan and Services Expected Discharge Plan: Home/Self Care       Living arrangements for the past 2 months: Single Family Home                 DME Arranged: N/A DME Agency: NA       HH Arranged: NA HH Agency: NA        Prior Living Arrangements/Services Living arrangements for the past 2 months: Single Family Home Lives with:: Spouse Patient language and need for interpreter reviewed:: Yes Do you feel safe going back to the place where you live?: Yes       Need for Family Participation in Patient Care: Yes (Comment) Care giver support system in place?: Yes (comment) Current home services: DME Criminal Activity/Legal Involvement Pertinent to Current Situation/Hospitalization: No - Comment as needed  Activities of Daily Living Home Assistive Devices/Equipment: Walker (specify type), Cane (specify quad or straight), Wheelchair ADL Screening (condition at time of admission) Patient's cognitive ability adequate to safely complete daily activities?: Yes Is the patient deaf or have difficulty hearing?: No Does the patient have difficulty seeing, even when wearing glasses/contacts?: No Does the patient have difficulty concentrating, remembering, or making decisions?: No Patient able to express need for assistance with ADLs?: Yes Does the patient have difficulty dressing or bathing?: No Independently performs ADLs?: Yes (appropriate for developmental age) Does the patient have difficulty walking or climbing stairs?: Yes Weakness of Legs: Right Weakness of Arms/Hands: None  Permission Sought/Granted Permission sought to share information with : Case Manager Permission granted to share information with : Yes, Verbal Permission Granted  Share Information with NAME: Quentin Angst [phone]            Emotional Assessment Appearance:: Appears stated age Attitude/Demeanor/Rapport: Engaged Affect (typically observed): Accepting Orientation: : Oriented to Self, Oriented to Place, Oriented to  Time, Oriented to Situation   Psych Involvement: No (comment)  Admission diagnosis:  RIGHT TIBIA NON UNION Patient Active  Problem List   Diagnosis Date Noted  . Hypertension   . Diabetes mellitus without complication (HCC)   . Anxiety   . Vitamin D deficiency   . Tibia and fibula open fracture, right, type I or II, with nonunion, subsequent encounter 03/28/2019  . Tibia/fibula fracture, shaft, right, open type I segmental 07/19/2018   PCP:   Hortencia ConradiGeorge, Robert E, NP Pharmacy:   [facility] [address] - [place], [place] - [address] [place] [address] Phone: [phone] Fax: [phone]     Social Determinants of Health (SDOH) Interventions    Readmission Risk Interventions No flowsheet data found.

## 2019-03-29 NOTE — Progress Notes (Signed)
Orthopedic Trauma Service Progress Note  Patient ID: Duane Collins MRN: [id number] DOB/AGE: 1961-07-11 58 y.o.  Subjective:  Doing ok  Very sore, primarily pain around ankle Did get up to use bathroom yesterday  Denies any numbness or tingling   No CP or SOB No Abd pain  No H/A or lightheadedness   + void + flatus  States he checks his CBGs 2x day Believes last a1c was about 6.3%   ROS As above  Objective:   VITALS:   Vitals:   03/28/19 1623 03/28/19 2022 03/29/19 0031 03/29/19 0610  BP: (!) 142/84 (!) 164/93 (!) 141/69 (!) 133/93  Pulse: 74 75 62 62  Resp: 20 20 20 16   Temp: 98.2 F (36.8 C) 98.3 F (36.8 C) 97.8 F (36.6 C) 97.8 F (36.6 C)  TempSrc: Oral Oral Oral Oral  SpO2: 98% 97% 100% 100%  Weight:      Height:        Estimated body mass index is 33.97 kg/m as calculated from the following:   Height as of this encounter: 5\' 9"  (1.753 m).   Weight as of this encounter: 104.3 kg.   Intake/Output      05/26 0701 - 05/27 0700 05/27 0701 - 05/28 0700   P.O. 1070    I.V. (mL/kg) 2318.7 (22.2)    IV Piggyback 140.5    Total Intake(mL/kg) 3529.3 (33.8)    Urine (mL/kg/hr) 850 (0.3)    Blood 50    Total Output 900    Net +2629.3         Urine Occurrence 1 x      LABS  Results for orders placed or performed during the hospital encounter of 03/28/19 (from the past 24 hour(s))  Aerobic/Anaerobic Culture (surgical/deep wound)     Status: None (Preliminary result)   Collection Time: 03/28/19  9:20 AM  Result Value Ref Range   Specimen Description WOUND LEG RIGHT    Special Requests RIGHT TIBIA    Gram Stain      RARE WBC PRESENT, PREDOMINANTLY PMN NO ORGANISMS SEEN Performed at [facility], [address] [place], [place] [address]    Culture PENDING    Report Status PENDING   Glucose, capillary     Status: Abnormal   Collection Time: 03/28/19 10:36 AM   Result Value Ref Range   Glucose-Capillary 127 (H) 70 - 99 mg/dL  Basic metabolic panel     Status: Abnormal   Collection Time: 03/29/19  2:56 AM  Result Value Ref Range   Sodium 135 135 - 145 mmol/L   Potassium 4.7 3.5 - 5.1 mmol/L   Chloride 101 98 - 111 mmol/L   CO2 26 22 - 32 mmol/L   Glucose, Bld 169 (H) 70 - 99 mg/dL   BUN 16 6 - 20 mg/dL   Creatinine, Ser 0.60 0.61 - 1.24 mg/dL   Calcium 8.8 (L) 8.9 - 10.3 mg/dL   GFR calc non Af Amer >60 >60 mL/min   GFR calc Af Amer >60 >60 mL/min   Anion gap 8 5 - 15     PHYSICAL EXAM:   Gen: appears well, NAD, sitting up in bed, pleasant Lungs: breathing unlabored Cardiac:regular  Abd: NTND Ext:       Right Lower Extremity   Dressing c/d/i (  it has been reinforced at the knee, no further strike-through noted)  Ext warm  Swelling controlled  DPN, SPN, TN sensation intact  EHL, FHL, AT, PT, peroneals, gastroc motor intact  No DCT  Compartments are soft  No pain with passive stretching   + DP pulse   Assessment/Plan: 1 Day Post-Op   Principal Problem:   Tibia and fibula open fracture, right, type I or II, with nonunion, subsequent encounter Active Problems:   Hypertension   Diabetes mellitus without complication (HCC)   Anxiety   Vitamin D deficiency   Anti-infectives (From admission, onward)   Start     Dose/Rate Route Frequency Ordered Stop   03/28/19 1500  ceFAZolin (ANCEF) IVPB 1 g/50 mL premix     1 g 100 mL/hr over 30 Minutes Intravenous Every 6 hours 03/28/19 1221 03/29/19 0337    .  POD/HD#: 1  58 y/o male s/p open R segmental tibia fracture with nonunion, work related accident  - Open R segmental tibia fracture with nonunion s/p exchange nailing   WBAT R leg  ROM as tolerated R knee and ankle  Ice and elevate R leg  PT/OT  Dressing change tomorrow     - Pain management:  Continue with current regimen    norco   Robaxin    Tylenol  - ABL anemia/Hemodynamics  Mild HTN   Resume home meds  H/H  looks good    - Medical issues   DM    Home meds   SSI    DM coordinator eval     Tight sugar control to promote appropriate bone healing    Vitamin d deficiency    Supplement        - DVT/PE prophylaxis:  Lovenox x 21 days   - ID:   periop abx  - Metabolic Bone Disease:  Vitamin d deficiency    Supplement  + vitamin c supplementation    Additional labs are pending   - Activity:  WBAT R leg  ROM as tolerated  - FEN/GI prophylaxis/Foley/Lines:  Carb mod diet   - Impediments to fracture healing:  DM  Vitamin d deficiency   Established nonunion   - Dispo:  PT/OT today  Home tomorrow      Mearl LatinKeith W. Ziya Coonrod, PA-C [phone] (C) 03/29/2019, 8:18 AM  [facility] [address] [place] [address] [phone] Collier Bullock(O) [phone] (F)

## 2019-03-30 DIAGNOSIS — S82201M Unspecified fracture of shaft of right tibia, subsequent encounter for open fracture type I or II with nonunion: Secondary | ICD-10-CM | POA: Diagnosis not present

## 2019-03-30 DIAGNOSIS — E291 Testicular hypofunction: Secondary | ICD-10-CM | POA: Diagnosis present

## 2019-03-30 LAB — BASIC METABOLIC PANEL
Anion gap: 6 (ref 5–15)
BUN: 11 mg/dL (ref 6–20)
CO2: 32 mmol/L (ref 22–32)
Calcium: 8.7 mg/dL — ABNORMAL LOW (ref 8.9–10.3)
Chloride: 101 mmol/L (ref 98–111)
Creatinine, Ser: 0.97 mg/dL (ref 0.61–1.24)
GFR calc Af Amer: >60 mL/min (ref >60)
GFR calc non Af Amer: >60 mL/min (ref >60)
Glucose, Bld: 202 mg/dL — ABNORMAL HIGH (ref 70–99)
Potassium: 4.3 mmol/L (ref 3.5–5.1)
Sodium: 139 mmol/L (ref 135–145)

## 2019-03-30 LAB — CBC
HCT: 30.6 % — ABNORMAL LOW (ref 39.0–52.0)
Hemoglobin: 10 g/dL — ABNORMAL LOW (ref 13.0–17.0)
MCH: 32.1 pg (ref 26.0–34.0)
MCHC: 32.7 g/dL (ref 30.0–36.0)
MCV: 98.1 fL (ref 80.0–100.0)
Platelets: 246 10*3/uL (ref 150–400)
RBC: 3.12 MIL/uL — ABNORMAL LOW (ref 4.22–5.81)
RDW: 13.9 % (ref 11.5–15.5)
WBC: 6.9 10*3/uL (ref 4.0–10.5)
nRBC: 0 % (ref 0.0–0.2)

## 2019-03-30 LAB — VITAMIN B12: Vitamin B-12: 151 pg/mL — ABNORMAL LOW (ref 180–914)

## 2019-03-30 LAB — FERRITIN: Ferritin: 42 ng/mL (ref 24–336)

## 2019-03-30 LAB — IRON AND TIBC
Iron: 22 ug/dL — ABNORMAL LOW (ref 45–182)
Saturation Ratios: 7 % — ABNORMAL LOW (ref 17.9–39.5)
TIBC: 319 ug/dL (ref 250–450)
UIBC: 297 ug/dL

## 2019-03-30 LAB — TESTOSTERONE, FREE: Testosterone, Free: 1.4 pg/mL — ABNORMAL LOW (ref 7.2–24.0)

## 2019-03-30 LAB — RETICULOCYTES
Immature Retic Fract: 17.3 % — ABNORMAL HIGH (ref 2.3–15.9)
RBC.: 3.12 MIL/uL — ABNORMAL LOW (ref 4.22–5.81)
Retic Count, Absolute: 41.2 10*3/uL (ref 19.0–186.0)
Retic Ct Pct: 1.3 % (ref 0.4–3.1)

## 2019-03-30 LAB — TSH: TSH: 3.688 u[IU]/mL (ref 0.350–4.500)

## 2019-03-30 LAB — GLUCOSE, CAPILLARY: Glucose-Capillary: 103 mg/dL — ABNORMAL HIGH (ref 70–99)

## 2019-03-30 LAB — PREALBUMIN: Prealbumin: 18.4 mg/dL (ref 18–38)

## 2019-03-30 LAB — TRANSFERRIN: Transferrin: 229 mg/dL (ref 180–329)

## 2019-03-30 LAB — PHOSPHORUS: Phosphorus: 2.7 mg/dL (ref 2.5–4.6)

## 2019-03-30 LAB — MAGNESIUM: Magnesium: 1.9 mg/dL (ref 1.7–2.4)

## 2019-03-30 LAB — FOLATE: Folate: 9.4 ng/mL (ref >5.9)

## 2019-03-30 MED ORDER — VITAMIN D3 125 MCG (5000 UT) PO TABS: 1.0000 | ORAL_TABLET | Freq: Every day | ORAL | 3 refills | Status: DC

## 2019-03-30 MED ORDER — ASPIRIN EC 325 MG PO TBEC: 325.0000 mg | DELAYED_RELEASE_TABLET | Freq: Every day | ORAL | 0 refills | Status: AC

## 2019-03-30 MED ORDER — METHOCARBAMOL 500 MG PO TABS: 500.0000 mg | ORAL_TABLET | Freq: Four times a day (QID) | ORAL | 0 refills | Status: DC | PRN

## 2019-03-30 MED ORDER — HYDROCODONE-ACETAMINOPHEN 5-325 MG PO TABS: 1.0000 | ORAL_TABLET | Freq: Four times a day (QID) | ORAL | 0 refills | Status: DC | PRN

## 2019-03-30 MED ORDER — ASCORBIC ACID 500 MG PO TABS: 500.0000 mg | ORAL_TABLET | Freq: Every day | ORAL | 0 refills | Status: DC

## 2019-03-30 MED ORDER — DOCUSATE SODIUM 100 MG PO CAPS: 100.0000 mg | ORAL_CAPSULE | Freq: Two times a day (BID) | ORAL | 0 refills | Status: DC

## 2019-03-30 NOTE — Discharge Instructions (Signed)
[facility] Discharge Instructions   General Discharge Instructions  WEIGHT BEARING STATUS: Weightbearing as tolerated Right Leg   RANGE OF MOTION/ACTIVITY: unrestricted range of motion right knee and ankle. Activity as tolerated   Bone health: lab work up shows that you do have vitamin d deficiency.  Continue to take both forms of vitamin d (50000 IUs Vitamin d 2 weekly and 5000 IU of vitamin d3 daily). This is important to maintain adequate levels of vitamin D to encourage bone healing.  You have also been prescribed vitamin c which important for soft tissue healing as well  Continue to maintain strict control over your diabetes.  Poorly controlled diabetes can continue to cause bone healing issues and vitamin d deficiency   We can review all of your labs at your next follow up   Wound Care: daily dressing changes starting on 04/01/2019 see below   Discharge Wound Care Instructions  Do NOT apply any ointments, solutions or lotions to pin sites or surgical wounds.  These prevent needed drainage and even though solutions like hydrogen peroxide kill bacteria, they also damage cells lining the pin sites that help fight infection.  Applying lotions or ointments can keep the wounds moist and can cause them to breakdown and open up as well. This can increase the risk for infection. When in doubt call the office.  Surgical incisions should be dressed daily.  If any drainage is noted, use one layer of adaptic, then gauze, Kerlix, and an ace wrap.  Once the incision is completely dry and without drainage, it may be left open to air out.  Showering may begin 36-48 hours later.  Cleaning gently with soap and water.  Traumatic wounds should be dressed daily as well.    One layer of adaptic, gauze, Kerlix, then ace wrap.  The adaptic can be discontinued once the draining has ceased    If you have a wet to dry dressing: wet the gauze with saline the squeeze as much saline out so  the gauze is moist (not soaking wet), place moistened gauze over wound, then place a dry gauze over the moist one, followed by Kerlix wrap, then ace wrap.  DVT/PE prophylaxis:  Aspirin 325 mg daily x 4 weeks   Diet: as you were eating previously.  Can use over the counter stool softeners and bowel preparations, such as Miralax, to help with bowel movements.  Narcotics can be constipating.  Be sure to drink plenty of fluids  PAIN MEDICATION USE AND EXPECTATIONS  You have likely been given narcotic medications to help control your pain.  After a traumatic event that results in an fracture (broken bone) with or without surgery, it is ok to use narcotic pain medications to help control one's pain.  We understand that everyone responds to pain differently and each individual patient will be evaluated on a regular basis for the continued need for narcotic medications. Ideally, narcotic medication use should last no more than 6-8 weeks (coinciding with fracture healing).   As a patient it is your responsibility as well to monitor narcotic medication use and report the amount and frequency you use these medications when you come to your office visit.   We would also advise that if you are using narcotic medications, you should take a dose prior to therapy to maximize you participation.  IF YOU ARE ON NARCOTIC MEDICATIONS IT IS NOT PERMISSIBLE TO OPERATE A MOTOR VEHICLE (MOTORCYCLE/CAR/TRUCK/MOPED) OR HEAVY MACHINERY DO NOT MIX NARCOTICS WITH OTHER  CNS (CENTRAL NERVOUS SYSTEM) DEPRESSANTS SUCH AS ALCOHOL   STOP SMOKING OR USING NICOTINE PRODUCTS!!!!  As discussed nicotine severely impairs your body's ability to heal surgical and traumatic wounds but also impairs bone healing.  Wounds and bone heal by forming microscopic blood vessels (angiogenesis) and nicotine is a vasoconstrictor (essentially, shrinks blood vessels).  Therefore, if vasoconstriction occurs to these microscopic blood vessels they essentially  disappear and are unable to deliver necessary nutrients to the healing tissue.  This is one modifiable factor that you can do to dramatically increase your chances of healing your injury.    (This means no smoking, no nicotine gum, patches, etc)  DO NOT USE NONSTEROIDAL ANTI-INFLAMMATORY DRUGS (NSAID'S)  Using products such as Advil (ibuprofen), Aleve (naproxen), Motrin (ibuprofen) for additional pain control during fracture healing can delay and/or prevent the healing response.  If you would like to take over the counter (OTC) medication, Tylenol (acetaminophen) is ok.  However, some narcotic medications that are given for pain control contain acetaminophen as well. Therefore, you should not exceed more than 4000 mg of tylenol in a day if you do not have liver disease.  Also note that there are may OTC medicines, such as cold medicines and allergy medicines that my contain tylenol as well.  If you have any questions about medications and/or interactions please ask your doctor/PA or your pharmacist.      ICE AND ELEVATE INJURED/OPERATIVE EXTREMITY  Using ice and elevating the injured extremity above your heart can help with swelling and pain control.  Icing in a pulsatile fashion, such as 20 minutes on and 20 minutes off, can be followed.    Do not place ice directly on skin. Make sure there is a barrier between to skin and the ice pack.    Using frozen items such as frozen peas works well as the conform nicely to the are that needs to be iced.  USE AN ACE WRAP OR TED HOSE FOR SWELLING CONTROL  In addition to icing and elevation, Ace wraps or TED hose are used to help limit and resolve swelling.  It is recommended to use Ace wraps or TED hose until you are informed to stop.    When using Ace Wraps start the wrapping distally (farthest away from the body) and wrap proximally (closer to the body)   Example: If you had surgery on your leg or thing and you do not have a splint on, start the ace wrap at  the toes and work your way up to the thigh        If you had surgery on your upper extremity and do not have a splint on, start the ace wrap at your fingers and work your way up to the upper arm  IF YOU ARE IN A SPLINT OR CAST DO NOT REMOVE IT FOR ANY REASON   If your splint gets wet for any reason please contact the office immediately. You may shower in your splint or cast as long as you keep it dry.  This can be done by wrapping in a cast cover or garbage back (or similar)  Do Not stick any thing down your splint or cast such as pencils, money, or hangers to try and scratch yourself with.  If you feel itchy take benadryl as prescribed on the bottle for itching  IF YOU ARE IN A CAM BOOT (BLACK BOOT)  You may remove boot periodically. Perform daily dressing changes as noted below.  Wash the liner  of the boot regularly and wear a sock when wearing the boot. It is recommended that you sleep in the boot until told otherwise  CALL THE OFFICE WITH ANY QUESTIONS OR CONCERNS: [phone]

## 2019-03-30 NOTE — Progress Notes (Signed)
Physical Therapy Treatment & Discharge Patient Details Name: Duane Collins MRN: [id number] DOB: [birth year] Today's Date: 03/30/2019    History of Present Illness Pt is a 59 y.o. 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 Labs negative for infection. Now s/p hardware removal and R tibia IM nail 5/26. PMH includes HTN, DM.   PT Comments    Pt progressing well with mobility. Mod indep ambulating >600' with RW. Reviewed RLE therex, including knee/hip ROM. Pt has met short-term acute PT goals; has no further questions or concerns, no DME needs. Pt hopeful for return home today. Will d/c acute PT.   Follow Up Recommendations  No PT follow up     Equipment Recommendations  None recommended by PT    Recommendations for Other Services       Precautions / Restrictions Precautions Precautions: Fall Restrictions Weight Bearing Restrictions: Yes RLE Weight Bearing: Weight bearing as tolerated    Mobility  Bed Mobility Overal bed mobility: Independent             General bed mobility comments: Indep with bed flat  Transfers Overall transfer level: Modified independent Equipment used: Rolling walker (2 wheeled) Transfers: Sit to/from Stand Sit to Stand: Modified independent (Device/Increase time)            Ambulation/Gait Ambulation/Gait assistance: Modified independent (Device/Increase time) Gait Distance (Feet): 700 Feet Assistive device: Rolling walker (2 wheeled) Gait Pattern/deviations: Step-through pattern;Decreased stride length;Decreased weight shift to right Gait velocity: Decreased Gait velocity interpretation: 1.31 - 2.62 ft/sec, indicative of limited community ambulator General Gait Details: Slow, steady gait with RW; improved ability to increase WBAT through RLE. Stopped to attempt R hip/knee flexion with high knee marching; limited ROM but improved since yesterday. Mod  indep   Stairs Stairs: (Declined; feels confident ascending steps in garage with RW and assist from wife)           Wheelchair Mobility    Modified Rankin (Stroke Patients Only)       Balance Overall balance assessment: Modified Independent             Standing balance comment: Can static stand without UE support                            Cognition Arousal/Alertness: Awake/alert Behavior During Therapy: WFL for tasks assessed/performed Overall Cognitive Status: Within Functional Limits for tasks assessed                                        Exercises      General Comments        Pertinent Vitals/Pain Pain Assessment: Faces Faces Pain Scale: Hurts little more Pain Location: RLE Pain Descriptors / Indicators: Guarding;Discomfort Pain Intervention(s): Limited activity within patient's tolerance    Home Living                      Prior Function            PT Goals (current goals can now be found in the care plan section) Progress towards PT goals: Goals met/education completed, patient discharged from PT    Frequency    Min 5X/week      PT Plan Current plan remains appropriate    Co-evaluation  AM-PAC PT "6 Clicks" Mobility   Outcome Measure  Help needed turning from your back to your side while in a flat bed without using bedrails?: None Help needed moving from lying on your back to sitting on the side of a flat bed without using bedrails?: None Help needed moving to and from a bed to a chair (including a wheelchair)?: None Help needed standing up from a chair using your arms (e.g., wheelchair or bedside chair)?: None Help needed to walk in hospital room?: None Help needed climbing 3-5 steps with a railing? : A Little 6 Click Score: 23    End of Session   Activity Tolerance: Patient tolerated treatment well Patient left: in chair;with call bell/phone within reach Nurse  Communication: Mobility status PT Visit Diagnosis: Unsteadiness on feet (R26.81);Pain;Difficulty in walking, not elsewhere classified (R26.2) Pain - Right/Left: Right Pain - part of body: Leg     Time: 7035-0093 PT Time Calculation (min) (ACUTE ONLY): 20 min  Charges:  $Gait Training: 8-22 mins                    Mabeline Caras, PT, DPT Acute Rehabilitation Services  Pager [phone] Office [place] 03/30/2019, 9:24 AM

## 2019-03-30 NOTE — Progress Notes (Signed)
Occupational Therapy Treatment Patient Details Name: Duane Collins MRN: [id number] DOB: 24-Aug-1961 Today's Date: 03/30/2019    History of present illness Pt is a 58 y.o. 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 Labs negative for infection. Now s/p hardware removal and R tibia IM nail 5/26. PMH includes HTN, DM.   OT comments  Pt performing functional mobility at mod I level.  With out AE, pt requires min assist for LB dressing only to don right sock (limited by bandaging). Can don shorts without AE at mod I level.  With AE, pt can don sock. Pt anticipates discharge home today. Education completed and goals met. No further acute OT needs.   Follow Up Recommendations  No OT follow up    Equipment Recommendations  None recommended by OT    Recommendations for Other Services      Precautions / Restrictions Precautions Precautions: Fall Restrictions Weight Bearing Restrictions: Yes RLE Weight Bearing: Weight bearing as tolerated       Mobility Bed Mobility Overal bed mobility: Independent             General bed mobility comments: Indep with bed flat  Transfers Overall transfer level: Modified independent Equipment used: Rolling walker (2 wheeled) Transfers: Sit to/from Stand Sit to Stand: Modified independent (Device/Increase time)              Balance Overall balance assessment: Modified Independent             Standing balance comment: Can static stand without UE support                           ADL either performed or assessed with clinical judgement   ADL Overall ADL's : Needs assistance/impaired             Lower Body Bathing: Modified independent;Sit to/from stand       Lower Body Dressing: Modified independent;Minimal assistance;Sit to/from stand;With adaptive equipment Lower Body Dressing Details (indicate cue type and reason): Pt able to reach  down to right LE to don sock. However, unable to pull sock around bandaging without assist (sock to small). With AE, pt is mod I.  Toilet Transfer: Modified Independent;Ambulation Toilet Transfer Details (indicate cue type and reason): ambulated in room with RW         Functional mobility during ADLs: Modified independent;Rolling walker General ADL Comments: Therapsit completed AE education for LB ADLs.     Vision       Perception     Praxis      Cognition Arousal/Alertness: Awake/alert Behavior During Therapy: WFL for tasks assessed/performed Overall Cognitive Status: Within Functional Limits for tasks assessed                                          Exercises     Shoulder Instructions       General Comments      Pertinent Vitals/ Pain       Pain Assessment: Faces Faces Pain Scale: Hurts a little bit Pain Location: RLE Pain Descriptors / Indicators: Guarding;Discomfort Pain Intervention(s): Monitored during session  Home Living  Prior Functioning/Environment              Frequency  Min 2X/week        Progress Toward Goals  OT Goals(current goals can now be found in the care plan section)  Progress towards OT goals: Goals met/education completed, patient discharged from OT  Acute Rehab OT Goals Patient Stated Goal: return home OT Goal Formulation: With patient Time For Goal Achievement: 04/05/19 ADL Goals Pt Will Perform Lower Body Bathing: with min guard assist;with supervision;with set-up;with caregiver independent in assisting Pt Will Perform Lower Body Dressing: with min guard assist;with supervision;with set-up;with caregiver independent in assisting Pt Will Transfer to Toilet: with modified independence;ambulating Pt Will Perform Toileting - Clothing Manipulation and hygiene: with supervision;with modified independence;sitting/lateral leans;with caregiver independent  in assisting Pt Will Perform Tub/Shower Transfer: with modified independence;ambulating;rolling walker  Plan Discharge plan remains appropriate;All goals met and education completed, patient discharged from OT services    Co-evaluation                 AM-PAC OT "6 Clicks" Daily Activity     Outcome Measure   Help from another person eating meals?: None Help from another person taking care of personal grooming?: None Help from another person toileting, which includes using toliet, bedpan, or urinal?: None Help from another person bathing (including washing, rinsing, drying)?: None Help from another person to put on and taking off regular upper body clothing?: None Help from another person to put on and taking off regular lower body clothing?: None 6 Click Score: 24    End of Session Equipment Utilized During Treatment: Rolling walker  OT Visit Diagnosis: Other abnormalities of gait and mobility (R26.89);Pain Pain - Right/Left: Right Pain - part of body: Leg   Activity Tolerance Patient tolerated treatment well   Patient Left in bed;with call bell/phone within reach;with nursing/sitter in room   Nurse Communication Mobility status        Time: 1856-3149 OT Time Calculation (min): 11 min  Charges: OT General Charges $OT Visit: 1 Visit OT Treatments $Self Care/Home Management : 8-22 mins     Darrol Jump OTR/L 03/30/2019, 10:03 AM

## 2019-03-30 NOTE — Progress Notes (Signed)
Orthopaedic Trauma Service (OTS)  2 Days Post-Op Procedure(s) (LRB): INTRAMEDULLARY (IM) NAIL RIGHT TIBIAL (Right) HARDWARE REMOVAL RIGHT TIBIA (Right)  Subjective: Patient reports pain as mild and improved.  Good progress with PT today.  Objective: Current Vitals Blood pressure (!) 145/72, pulse 72, temperature 98.4 F (36.9 C), temperature source Oral, resp. rate 16, height 5\' 9"  (1.753 m), weight 104.3 kg, SpO2 97 %. Vital signs in last 24 hours: Temp:  [98.2 F (36.8 C)-98.6 F (37 C)] 98.4 F (36.9 C) (05/28 0345) Pulse Rate:  [64-72] 72 (05/28 0900) Resp:  [16-17] 16 (05/28 0345) BP: (130-145)/(72-79) 145/72 (05/28 0900) SpO2:  [94 %-97 %] 97 % (05/28 0345)  Intake/Output from previous day: 05/27 0701 - 05/28 0700 In: -  Out: 900 [Urine:900]  LABS Recent Labs    03/28/19 0628 03/30/19 0421  HGB 12.6* 10.0*   Recent Labs    03/28/19 0628 03/30/19 0421  WBC 5.6 6.9  RBC 3.97* 3.12*  3.12*  HCT 39.4 30.6*  PLT 273 246   Recent Labs    03/29/19 0256 03/30/19 0421  NA 135 139  K 4.7 4.3  CL 101 101  CO2 26 32  BUN 16 11  CREATININE 0.97 0.97  GLUCOSE 169* 202*  CALCIUM 8.8* 8.7*   Recent Labs    03/28/19 0628  INR 1.0     Physical Exam RLE Dressing intact, clean, dry  Edema/ swelling controlled  Sens: DPN, SPN, TN intact  Motor: EHL, FHL, and lessor toe ext and flex all intact grossly  Brisk cap refill, warm to touch  Assessment/Plan: 2 Days Post-Op Procedure(s) (LRB): INTRAMEDULLARY (IM) NAIL RIGHT TIBIAL (Right) HARDWARE REMOVAL RIGHT TIBIA (Right) 1. PT done today; d/c to home 2. DVT proph Lovenox; to [facility] at home 3. F/u 8-14 days  Myrene Galas, MD [facility] [phone] (p)

## 2019-03-30 NOTE — Plan of Care (Signed)
  Problem: Nutrition: Goal: Adequate nutrition will be maintained Outcome: Progressing   Problem: Pain Managment: Goal: General experience of comfort will improve Outcome: Progressing   

## 2019-03-30 NOTE — Progress Notes (Signed)
Discharge instructions provided and the patient was given written discharge instructions for home.  The patient is aware of prescription medications and his follow up appointment.  The patient will be discharged to home via wheel chair

## 2019-03-30 NOTE — Discharge Summary (Signed)
Orthopaedic Trauma Service (OTS) Discharge Summary   Patient ID: Duane Collins MRN: [id number] DOB/AGE: 1961-06-19 58 y.o.  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 Carola Frost 1. INTRAMEDULLARY (IM) NAIL RIGHT TIBIAL (Right) 11 MM X 360 MM STATICALLY LOCKED FOR NONUNION 2. HARDWARE REMOVAL RIGHT TIBIA (Right)  Discharged Condition: good  Hospital Course:   Patient is a 58 year old black male who sustained an open right tibia and fibula fracture approximately 8 months ago in a work-related accident.  He was treated initially with irrigation debridement and intramedullary nailing of a segmental open tibia fracture.  Unfortunately he went on to develop a symptomatic nonunion of the distal fracture site.  Patient was referred to the orthopedic trauma service for definitive management.  Patient seen and evaluated in the office.  Inflammatory labs were obtained which did not demonstrate any evidence of infection.  Clinical exam also was not suggestive of infection.  Patient was admitted to the hospital on 03/28/2019 where the procedure noted above was performed.  Exchange intramedullary nailing was performed with removal of his old hardware, re-reaming of his medullary canal and placement of a new  intramedullary device.  Additionally metabolic bone work-up was performed during his hospitalization to identify a potential sources of correction to achieve consolidation of his nonunion site.  It was an open fracture and these do inherently have a longer healing time however patient being 8 months postop should have progressed to complete union by this point.  Metabolic bone work-up was notable for vitamin D deficiency, elevated hemoglobin A1c of 6.9%, diminished free testosterone level (remainder of his testosterone panel was pending at the time of discharge), normal TSH and his PTH is pending at the time of discharge as well.  Patient was started on vitamin D supplementation as well as vitamin C supplementation.  We would anticipate referring to PCP for further evaluation of his testosterone deficiency/hypogonadism.  We may re-evaluate his labs in 8 to 12 weeks to see if they remain seriously depressed.  I think if his total testosterone is less than 150 we will likely refer for treatment.  As this will have direct impact on his ability to heal.  We also additionally discussed his elevated hemoglobin A1c.  Patient's A1c ranges in the low 6% by his report this does appear to be elevated it could possibly be that it is elevated due to encourage stress related to his nonunion we did discuss the importance of maintaining tight blood sugar control to help establish and achieve complete consolidation of his nonunion site.  As noted above patient was admitted on 03/28/2019 for the procedure noted above after surgery patient was transferred to the PACU for recovery from anesthesia and then transferred to the orthopedic floor for observation, pain control and therapies.  On postoperative day #  1 patient was having fairly significant pain with's restricted mobility.  He was still requiring moderate amount of IV pain medication to achieve adequate comfort.  He was covered with Lovenox for DVT and PE prophylaxis during his  admission as well as SCDs.  Routine perioperative antibiotic treatment was followed for his perioperative course.  On postoperative day #2 patient was mobilizing much better with therapy and his pain was better controlled with oral pain medications and he was deemed to be stable for discharge on postoperative day #2 to home with home health services.  Patient will be transition to enteric-coated aspirin 325 mg daily for the next 4 weeks for VTE prophylaxis as he is mobile and weightbearing on his operative side.  We feel that this is adequate given his current clinical condition.  Patient was covered with sliding scale insulin during his admission as well.  Good blood sugar control noted during hospitalization  Patient will resume the use of his low intensity pulsed ultrasound bone stimulator (oxygen).  He received this approximately month prior to the surgery.  This would be an excellent adjuvant to help promote complete consolidation of his nonunion site.  Again he needs to maintain tight blood sugar control.  We will recheck his vitamin D labs in about 8 weeks to make sure that he is trending in the right direction.  I do feel that his vitamin D deficiency is related to his suboptimally controlled diabetes as well as his testosterone deficiency.  Patient will follow-up with orthopedics in 2 weeks for reevaluation follow-up x-rays suture removal.  Would also recommend follow-up with his primary care physician in the next month or so as well.  Long-term patient is at risk for the development of subsequent fractures particularly if you continue to have vitamin D deficiency as well as poor control of his diabetes.  Would recommend perhaps obtaining a baseline bone density scan once the patient is healed from this injury particularly given his hypogonadal state.  If his T score is within the osteoporotic range we can then consider the use of Forteo for the treatment of hypogonadal related osteoporosis.  This is just  something to keep in mind for long-term health for the patient and his wellbeing.  Will also have significant implications over his quality of life as well.   Consults: None  Significant Diagnostic Studies: labs:   Results for Duane Collins, Duane Collins (MRN [id number]) as of 03/30/2019 12:30  Ref. Range 07/20/2018 16:18 07/21/2018 08:20 07/21/2018 12:07 03/24/2019 13:26 03/28/2019 05:56 03/28/2019 06:28 03/28/2019 09:20 03/28/2019 10:05 03/28/2019 10:36 03/28/2019 13:38 03/29/2019 02:56 03/29/2019 09:56 03/29/2019 12:04 03/29/2019 17:21 03/29/2019 20:10 03/30/2019 04:21 03/30/2019 07:23  Hemoglobin A1C Latest Ref Range: 4.8 - 5.6 %      6.9 (H)              Results for Duane Collins, Duane Collins (MRN [id number]) as of 03/30/2019 12:30  Ref. Range 03/28/2019 06:28  Vit D, 1,25-Dihydroxy Latest Ref Range: 19.9 - 79.3 pg/mL 49.3  Vitamin D, 25-Hydroxy Latest Ref Range: 30.0 - 100.0 ng/mL 20.2 (L)   Results for Duane Collins, Duane Collins (MRN [id number]) as of 03/30/2019 12:30  Ref. Range 03/30/2019 04:21  Sodium Latest Ref Range: 135 - 145 mmol/L 139  Potassium Latest Ref Range: 3.5 - 5.1 mmol/L 4.3  Chloride Latest Ref Range: 98 - 111 mmol/L 101  CO2 Latest Ref Range: 22 - 32 mmol/L 32  Glucose Latest Ref Range: 70 - 99 mg/dL 846 (H)  BUN Latest Ref Range:  6 - 20 mg/dL 11  Creatinine Latest Ref Range: 0.61 - 1.24 mg/dL 1.610.97  Calcium Latest Ref Range: 8.9 - 10.3 mg/dL 8.7 (L)  Anion gap Latest Ref Range: 5 - 15  6  Phosphorus Latest Ref Range: 2.5 - 4.6 mg/dL 2.7  Magnesium Latest Ref Range: 1.7 - 2.4 mg/dL 1.9  PREALBUMIN Latest Ref Range: 18 - 38 mg/dL 09.618.4  GFR, Est Non African American Latest Ref Range: >60 mL/min >60  GFR, Est African American Latest Ref Range: >60 mL/min >60  Iron Latest Ref Range: 45 - 182 ug/dL 22 (L)  UIBC Latest Units: ug/dL 045297  TIBC Latest Ref Range: 250 - 450 ug/dL 409319  Saturation Ratios Latest Ref Range: 17.9 - 39.5 % 7 (L)  Ferritin Latest Ref Range: 24 - 336 ng/mL 42  Transferrin Latest Ref Range:  180 - 329 mg/dL 811229  Folate Latest Ref Range: >5.9 ng/mL 9.4  Vitamin B12 Latest Ref Range: 180 - 914 pg/mL 151 (L)  WBC Latest Ref Range: 4.0 - 10.5 K/uL 6.9  RBC Latest Ref Range: 4.22 - 5.81 MIL/uL 3.12 (L)  Hemoglobin Latest Ref Range: 13.0 - 17.0 g/dL 91.410.0 (L)  HCT Latest Ref Range: 39.0 - 52.0 % 30.6 (L)  MCV Latest Ref Range: 80.0 - 100.0 fL 98.1  MCH Latest Ref Range: 26.0 - 34.0 pg 32.1  MCHC Latest Ref Range: 30.0 - 36.0 g/dL 78.232.7  RDW Latest Ref Range: 11.5 - 15.5 % 13.9  Platelets Latest Ref Range: 150 - 400 K/uL 246  nRBC Latest Ref Range: 0.0 - 0.2 % 0.0   Results for Duane Collins, Duane Collins (MRN [id number]) as of 03/30/2019 12:30  Ref. Range 03/30/2019 04:21  TSH Latest Ref Range: 0.350 - 4.500 uIU/mL 3.688   Results for Duane Collins, Duane Collins (MRN [id number]) as of 03/30/2019 12:30  Ref. Range 03/29/2019 09:56  Testosterone Free Latest Ref Range: 7.2 - 24.0 pg/mL 1.4 (L)     Microbiology  Intra-operative cultures   No growth to date   No organisms seen on gram stain   Treatments: IV hydration, antibiotics: Ancef, analgesia: acetaminophen, Dilaudid and Norco, anticoagulation: Lovenox with transition to aspirin at discharge, insulin: Sliding scale NovoLog, therapies: PT, OT and RN and surgery: As above  Discharge Exam:  Orthopaedic Trauma Service (OTS)  2 Days Post-Op Procedure(s) (LRB): INTRAMEDULLARY (IM) NAIL RIGHT TIBIAL (Right) HARDWARE REMOVAL RIGHT TIBIA (Right)  Subjective: Patient reports pain as mild and improved.  Good progress with PT today.  Objective: Current Vitals Blood pressure (!) 145/72, pulse 72, temperature 98.4 F (36.9 C), temperature source Oral, resp. rate 16, height 5\' 9"  (1.753 m), weight 104.3 kg, SpO2 97 %. Vital signs in last 24 hours: Temp:  [98.2 F (36.8 C)-98.6 F (37 C)] 98.4 F (36.9 C) (05/28 0345) Pulse Rate:  [64-72] 72 (05/28 0900) Resp:  [16-17] 16 (05/28 0345) BP: (130-145)/(72-79) 145/72 (05/28 0900) SpO2:  [94 %-97 %]  97 % (05/28 0345)  Intake/Output from previous day: 05/27 0701 - 05/28 0700 In: -  Out: 900 [Urine:900]  LABS RecentLabs(last2labs)      Recent Labs    03/28/19 0628 03/30/19 0421  HGB 12.6* 10.0*     RecentLabs(last2labs)      Recent Labs    03/28/19 0628 03/30/19 0421  WBC 5.6 6.9  RBC 3.97* 3.12*   3.12*  HCT 39.4 30.6*  PLT 273 246     RecentLabs(last2labs)  Recent Labs    03/29/19 0256 03/30/19 0421  NA 135  139  K 4.7 4.3  CL 101 101  CO2 26 32  BUN 16 11  CREATININE 0.97 0.97  GLUCOSE 169* 202*  CALCIUM 8.8* 8.7*     RecentLabs(last2labs)     Recent Labs    03/28/19 0628  INR 1.0       Physical Exam RLE     Dressing intact, clean, dry             Edema/ swelling controlled             Sens: DPN, SPN, TN intact             Motor: EHL, FHL, and lessor toe ext and flex all intact grossly             Brisk cap refill, warm to touch  Assessment/Plan: 2 Days Post-Op Procedure(s) (LRB): INTRAMEDULLARY (IM) NAIL RIGHT TIBIAL (Right) HARDWARE REMOVAL RIGHT TIBIA (Right) 1. PT done today; d/c to home 2. DVT proph Lovenox; to [facility]ASA at home 3. F/u 8-14 days  Myrene Galas, MD [facility], [place] [phone] (p)   Disposition: Discharge disposition: 01-Home or Self Care       Discharge Instructions    Call MD / Call 911   Complete by:  As directed    If you experience chest pain or shortness of breath, CALL 911 and be transported to the hospital emergency room.  If you develope a fever above 101 F, pus (white drainage) or increased drainage or redness at the wound, or calf pain, call your surgeon's office.   Constipation Prevention   Complete by:  As directed    Drink plenty of fluids.  Prune juice may be helpful.  You may use a stool softener, such as Colace (over the counter) 100 mg twice a day.  Use MiraLax (over the counter) for constipation as needed.   Diet Carb Modified    Complete by:  As directed    Discharge instructions   Complete by:  As directed    Orthopaedic Trauma Service Discharge Instructions   General Discharge Instructions  WEIGHT BEARING STATUS: Weightbearing as tolerated Right Leg   RANGE OF MOTION/ACTIVITY: unrestricted range of motion right knee and ankle. Activity as tolerated   Bone health: lab work up shows that you do have vitamin d deficiency.  Continue to take both forms of vitamin d (50000 IUs Vitamin d 2 weekly and 5000 IU of vitamin d3 daily). This is important to maintain adequate levels of vitamin D to encourage bone healing.  You have also been prescribed vitamin c which important for soft tissue healing as well  Continue to maintain strict control over your diabetes.  Poorly controlled diabetes can continue to cause bone healing issues and vitamin d deficiency   We can review all of your labs at your next follow up   Wound Care: daily dressing changes starting on 04/01/2019 see below   Discharge Wound Care Instructions  Do NOT apply any ointments, solutions or lotions to pin sites or surgical wounds.  These prevent needed drainage and even though solutions like hydrogen peroxide kill bacteria, they also damage cells lining the pin sites that help fight infection.  Applying lotions or ointments can keep the wounds moist and can cause them to breakdown and open up as well. This can increase the risk for infection. When in doubt call the office.  Surgical incisions should be dressed daily.  If any drainage is noted, use one layer of adaptic, then  gauze, Kerlix, and an ace wrap.  Once the incision is completely dry and without drainage, it may be left open to air out.  Showering may begin 36-48 hours later.  Cleaning gently with soap and water.  Traumatic wounds should be dressed daily as well.    One layer of adaptic, gauze, Kerlix, then ace wrap.  The adaptic can be discontinued once the draining has ceased    If you have a  wet to dry dressing: wet the gauze with saline the squeeze as much saline out so the gauze is moist (not soaking wet), place moistened gauze over wound, then place a dry gauze over the moist one, followed by Kerlix wrap, then ace wrap.  DVT/PE prophylaxis:  Aspirin 325 mg daily x 4 weeks   Diet: as you were eating previously.  Can use over the counter stool softeners and bowel preparations, such as Miralax, to help with bowel movements.  Narcotics can be constipating.  Be sure to drink plenty of fluids  PAIN MEDICATION USE AND EXPECTATIONS  You have likely been given narcotic medications to help control your pain.  After a traumatic event that results in an fracture (broken bone) with or without surgery, it is ok to use narcotic pain medications to help control one's pain.  We understand that everyone responds to pain differently and each individual patient will be evaluated on a regular basis for the continued need for narcotic medications. Ideally, narcotic medication use should last no more than 6-8 weeks (coinciding with fracture healing).   As a patient it is your responsibility as well to monitor narcotic medication use and report the amount and frequency you use these medications when you come to your office visit.   We would also advise that if you are using narcotic medications, you should take a dose prior to therapy to maximize you participation.  IF YOU ARE ON NARCOTIC MEDICATIONS IT IS NOT PERMISSIBLE TO OPERATE A MOTOR VEHICLE (MOTORCYCLE/CAR/TRUCK/MOPED) OR HEAVY MACHINERY DO NOT MIX NARCOTICS WITH OTHER CNS (CENTRAL NERVOUS SYSTEM) DEPRESSANTS SUCH AS ALCOHOL   STOP SMOKING OR USING NICOTINE PRODUCTS!!!!  As discussed nicotine severely impairs your body's ability to heal surgical and traumatic wounds but also impairs bone healing.  Wounds and bone heal by forming microscopic blood vessels (angiogenesis) and nicotine is a vasoconstrictor (essentially, shrinks blood vessels).   Therefore, if vasoconstriction occurs to these microscopic blood vessels they essentially disappear and are unable to deliver necessary nutrients to the healing tissue.  This is one modifiable factor that you can do to dramatically increase your chances of healing your injury.    (This means no smoking, no nicotine gum, patches, etc)  DO NOT USE NONSTEROIDAL ANTI-INFLAMMATORY DRUGS (NSAID'S)  Using products such as Advil (ibuprofen), Aleve (naproxen), Motrin (ibuprofen) for additional pain control during fracture healing can delay and/or prevent the healing response.  If you would like to take over the counter (OTC) medication, Tylenol (acetaminophen) is ok.  However, some narcotic medications that are given for pain control contain acetaminophen as well. Therefore, you should not exceed more than 4000 mg of tylenol in a day if you do not have liver disease.  Also note that there are may OTC medicines, such as cold medicines and allergy medicines that my contain tylenol as well.  If you have any questions about medications and/or interactions please ask your doctor/PA or your pharmacist.      ICE AND ELEVATE INJURED/OPERATIVE EXTREMITY  Using ice and elevating the injured  extremity above your heart can help with swelling and pain control.  Icing in a pulsatile fashion, such as 20 minutes on and 20 minutes off, can be followed.    Do not place ice directly on skin. Make sure there is a barrier between to skin and the ice pack.    Using frozen items such as frozen peas works well as the conform nicely to the are that needs to be iced.  USE AN ACE WRAP OR TED HOSE FOR SWELLING CONTROL  In addition to icing and elevation, Ace wraps or TED hose are used to help limit and resolve swelling.  It is recommended to use Ace wraps or TED hose until you are informed to stop.    When using Ace Wraps start the wrapping distally (farthest away from the body) and wrap proximally (closer to the body)   Example: If you  had surgery on your leg or thing and you do not have a splint on, start the ace wrap at the toes and work your way up to the thigh        If you had surgery on your upper extremity and do not have a splint on, start the ace wrap at your fingers and work your way up to the upper arm  IF YOU ARE IN A SPLINT OR CAST DO NOT REMOVE IT FOR ANY REASON   If your splint gets wet for any reason please contact the office immediately. You may shower in your splint or cast as long as you keep it dry.  This can be done by wrapping in a cast cover or garbage back (or similar)  Do Not stick any thing down your splint or cast such as pencils, money, or hangers to try and scratch yourself with.  If you feel itchy take benadryl as prescribed on the bottle for itching  IF YOU ARE IN A CAM BOOT (BLACK BOOT)  You may remove boot periodically. Perform daily dressing changes as noted below.  Wash the liner of the boot regularly and wear a sock when wearing the boot. It is recommended that you sleep in the boot until told otherwise  CALL THE OFFICE WITH ANY QUESTIONS OR CONCERNS: [phone]   Driving restrictions   Complete by:  As directed    No driving until off all pain medications   Increase activity slowly as tolerated   Complete by:  As directed    Weight bearing as tolerated   Complete by:  As directed      Allergies as of 03/30/2019   No Known Allergies     Medication List    TAKE these medications   ascorbic acid 500 MG tablet Commonly known as:  VITAMIN C Take 1 tablet (500 mg total) by mouth daily. Start taking on:  Mar 31, 2019   aspirin EC 325 MG tablet Take 1 tablet (325 mg total) by mouth daily for 30 days.   carvedilol 3.125 MG tablet Commonly known as:  COREG Take 3.125 mg by mouth 2 (two) times daily.   citalopram 40 MG tablet Commonly known as:  CELEXA Take 40 mg by mouth daily.   docusate sodium 100 MG capsule Commonly known as:  COLACE Take 1 capsule (100 mg total) by mouth  2 (two) times daily.   Farxiga 10 MG Tabs tablet Generic drug:  dapagliflozin propanediol Take 10 mg by mouth daily.   ferrous sulfate 325 (65 FE) MG tablet Take 325 mg by mouth once a  week. Monday   HYDROcodone-acetaminophen 5-325 MG tablet Commonly known as:  NORCO/VICODIN Take 1-2 tablets by mouth every 6 (six) hours as needed for moderate pain or severe pain.   metFORMIN 1000 MG tablet Commonly known as:  GLUCOPHAGE Take 1,000 mg by mouth 2 (two) times daily with a meal.   methocarbamol 500 MG tablet Commonly known as:  ROBAXIN Take 1-2 tablets (500-1,000 mg total) by mouth every 6 (six) hours as needed for muscle spasms.   Muscle Rub Ultra Strength 02-09-29 % Crea Generic drug:  Camphor-Menthol-Methyl Sal Apply 1 application topically as needed (muscle cramps).   simvastatin 40 MG tablet Commonly known as:  ZOCOR Take 40 mg by mouth every evening.   Vitamin D (Ergocalciferol) 1.25 MG (50000 UT) Caps capsule Commonly known as:  DRISDOL Take 50,000 Units by mouth every Monday.   Vitamin D3 125 MCG (5000 UT) Tabs Take 1 tablet (5,000 Units total) by mouth daily.            Discharge Care Instructions  (From admission, onward)         Start     Ordered   03/30/19 0000  Weight bearing as tolerated     03/30/19 1241         Follow-up Information    Myrene Galas, MD. Schedule an appointment as soon as possible for a visit in 2 week(s).   Specialty:  Orthopedic Surgery Why:  for suture removal and follow up xrays  Contact information: [address] [place] [address] [phone]           Discharge Instructions and Plan:  58 y/o male s/p open Collins segmental tibia fracture with nonunion, work related accident   - Open Collins segmental tibia fracture with nonunion s/p exchange nailing              WBAT Collins leg             ROM as tolerated Collins knee and ankle             Ice and elevate Collins leg             PT/OT             Dressing changes as needed      Comprehensive wound care instructions included in discharge paperwork                - Pain management:             Continue with current regimen                          norco                         Robaxin                            - Medical issues              DM                            Tight sugar control to promote appropriate bone healing                Vitamin d deficiency  Supplement                                                    - DVT/PE prophylaxis:            ASA 325 mg daily x 4 weeks    - ID:              periop abx completed    - Metabolic Bone Disease:             Vitamin d deficiency                          Supplement             + vitamin c supplementation                Suspected hypogonadism   Free testosterone is significantly diminished   Awaiting remaining testosterone panel   Follow-up with PCP regarding possible TRT   Likely related to his diabetes    This will have direct impact on his ability to heal his fracture and may have contributed to his nonunion along with the fact that it was an open fracture.  - Activity:             WBAT Collins leg             ROM as tolerated   - FEN/GI prophylaxis/Foley/Lines:             Carb mod diet    - Impediments to fracture healing:             DM             Vitamin d deficiency              Established nonunion/open fracture  Testosterone deficiency   - Dispo:            Discharge home today  Follow-up with Ortho in 2 weeks  Follow-up with PCP in 4 weeks   Signed:  Mearl Latin, PA-C [phone] (C) 03/30/2019, 12:45 PM  [facility] [address] [place] [address] [phone] Collier Bullock (F)

## 2019-03-31 LAB — CALCIUM, IONIZED: Calcium, Ionized, Serum: 4.9 mg/dL (ref 4.5–5.6)

## 2019-04-01 LAB — PTH, INTACT AND CALCIUM
Calcium, Total (PTH): 8.5 mg/dL — ABNORMAL LOW (ref 8.7–10.2)
PTH: 27 pg/mL (ref 15–65)

## 2019-04-01 LAB — TESTOSTERONE: Testosterone: 195 ng/dL — ABNORMAL LOW (ref 264–916)

## 2019-04-01 LAB — SEX HORMONE BINDING GLOBULIN: Sex Hormone Binding: 31.1 nmol/L (ref 19.3–76.4)

## 2019-04-02 LAB — AEROBIC/ANAEROBIC CULTURE W GRAM STAIN (SURGICAL/DEEP WOUND)

## 2019-04-03 LAB — TESTOSTERONE, % FREE: Testosterone-% Free: 1.2 % — ABNORMAL HIGH (ref 0.2–0.7)

## 2019-04-04 LAB — NICOTINE/COTININE METABOLITES
Cotinine: <1 ng/mL
Nicotine: <1 ng/mL

## 2019-07-21 ENCOUNTER — Other Ambulatory Visit: Payer: Self-pay | Admitting: Orthopedic Surgery

## 2019-07-21 DIAGNOSIS — S82201K Unspecified fracture of shaft of right tibia, subsequent encounter for closed fracture with nonunion: Secondary | ICD-10-CM

## 2019-07-31 ENCOUNTER — Other Ambulatory Visit: Payer: Self-pay

## 2019-07-31 ENCOUNTER — Ambulatory Visit
Admission: RE | Admit: 2019-07-31 | Discharge: 2019-07-31 | Disposition: A | Discharge status: Home / Self Care | Payer: Worker's Compensation | Source: Ambulatory Visit | Attending: Orthopedic Surgery | Admitting: Orthopedic Surgery

## 2019-07-31 DIAGNOSIS — S82201K Unspecified fracture of shaft of right tibia, subsequent encounter for closed fracture with nonunion: Secondary | ICD-10-CM

## 2019-10-11 NOTE — Progress Notes (Signed)
[id number] Rosalita Levan, [place] - [address] [place] [address] Phone: [phone] Fax: [phone]      Your procedure is scheduled on December 14  Report to [facility] Main Entrance "A" at 0530 A.M., and check in at the Admitting office.  Call this number if you have problems the morning of surgery:  [phone]  Call [phone] if you have any questions prior to your surgery date Monday-Friday 8am-4pm    Remember:  Do not eat or drink after midnight the night before your surgery    Take these medicines the morning of surgery with A SIP OF WATER  carvedilol (COREG) citalopram (CELEXA) docusate sodium (COLACE) HYDROcodone-acetaminophen (NORCO/VICODIN) if needed methocarbamol (ROBAXIN  7 days prior to surgery STOP taking any Aspirin (unless otherwise instructed by your surgeon), Aleve, Naproxen, Ibuprofen, Motrin, Advil, Goody's, BC's, all herbal medications, fish oil, and all vitamins.   WHAT DO I DO ABOUT MY DIABETES MEDICATION?   Marland Kitchen Do not take oral diabetes medicines (pills) the morning of surgery. dapagliflozin propanediol (FARXIGA),  metFORMIN (GLUCOPHAGE)   HOW TO MANAGE YOUR DIABETES BEFORE AND AFTER SURGERY  Why is it important to control my blood sugar before and after surgery? . Improving blood sugar levels before and after surgery helps healing and can limit problems. . A way of improving blood sugar control is eating a healthy diet by: o  Eating less sugar and carbohydrates o  Increasing activity/exercise o  Talking with your doctor about reaching your blood sugar goals . High blood sugars (greater than 180 mg/dL) can raise your risk of infections and slow your recovery, so you will need to focus on controlling your diabetes during the weeks before surgery. . Make sure that the doctor who takes care of your diabetes knows about your planned surgery including the date and location.  How do I manage my blood sugar  before surgery? . Check your blood sugar at least 4 times a day, starting 2 days before surgery, to make sure that the level is not too high or low. . Check your blood sugar the morning of your surgery when you wake up and every 2 hours until you get to the Short Stay unit. o If your blood sugar is less than 70 mg/dL, you will need to treat for low blood sugar: - Do not take insulin. - Treat a low blood sugar (less than 70 mg/dL) with  cup of clear juice (cranberry or apple), 4 glucose tablets, OR glucose gel. - Recheck blood sugar in 15 minutes after treatment (to make sure it is greater than 70 mg/dL). If your blood sugar is not greater than 70 mg/dL on recheck, call [phone] for further instructions. . Report your blood sugar to the short stay nurse when you get to Short Stay.  . If you are admitted to the hospital after surgery: o Your blood sugar will be checked by the staff and you will probably be given insulin after surgery (instead of oral diabetes medicines) to make sure you have good blood sugar levels. o The goal for blood sugar control after surgery is 80-180 mg/dL    The Morning of Surgery  Do not wear jewelry.  Do not wear lotions, powders, or colognes, or deodorant  Men may shave face and neck.  Do not bring valuables to the hospital.  [facility] is not responsible for any belongings or valuables.  If you are a smoker, DO NOT Smoke 24 hours  prior to surgery  If you wear a CPAP at night please bring your mask, tubing, and machine the morning of surgery   Remember that you must have someone to transport you home after your surgery, and remain with you for 24 hours if you are discharged the same day.   Please bring cases for contacts, glasses, hearing aids, dentures or bridgework because it cannot be worn into surgery.    Leave your suitcase in the car.  After surgery it may be brought to your room.  For patients admitted to the hospital, discharge time will be  determined by your treatment team.  Patients discharged the day of surgery will not be allowed to drive home.    Special instructions:   [facility]- Preparing For Surgery  Before surgery, you can play an important role. Because skin is not sterile, your skin needs to be as free of germs as possible. You can reduce the number of germs on your skin by washing with CHG (chlorahexidine gluconate) Soap before surgery.  CHG is an antiseptic cleaner which kills germs and bonds with the skin to continue killing germs even after washing.    Oral Hygiene is also important to reduce your risk of infection.  Remember - BRUSH YOUR TEETH THE MORNING OF SURGERY WITH YOUR REGULAR TOOTHPASTE  Please do not use if you have an allergy to CHG or antibacterial soaps. If your skin becomes reddened/irritated stop using the CHG.  Do not shave (including legs and underarms) for at least 48 hours prior to first CHG shower. It is OK to shave your face.  Please follow these instructions carefully.   1. Shower the NIGHT BEFORE SURGERY and the MORNING OF SURGERY with CHG Soap.   2. If you chose to wash your hair, wash your hair first as usual with your normal shampoo.  3. After you shampoo, rinse your hair and body thoroughly to remove the shampoo.  4. Use CHG as you would any other liquid soap. You can apply CHG directly to the skin and wash gently with a scrungie or a clean washcloth.   5. Apply the CHG Soap to your body ONLY FROM THE NECK DOWN.  Do not use on open wounds or open sores. Avoid contact with your eyes, ears, mouth and genitals (private parts). Wash Face and genitals (private parts)  with your normal soap.   6. Wash thoroughly, paying special attention to the area where your surgery will be performed.  7. Thoroughly rinse your body with warm water from the neck down.  8. DO NOT shower/wash with your normal soap after using and rinsing off the CHG Soap.  9. Pat yourself dry with a CLEAN  TOWEL.  10. Wear CLEAN PAJAMAS to bed the night before surgery, wear comfortable clothes the morning of surgery  11. Place CLEAN SHEETS on your bed the night of your first shower and DO NOT SLEEP WITH PETS.    Day of Surgery:  Please shower the morning of surgery with the CHG soap Do not apply any deodorants/lotions. Please wear clean clothes to the hospital/surgery center.   Remember to brush your teeth WITH YOUR REGULAR TOOTHPASTE.   Please read over the following fact sheets that you were given.

## 2019-10-12 ENCOUNTER — Other Ambulatory Visit (HOSPITAL_COMMUNITY)
Admission: RE | Admit: 2019-10-12 | Discharge: 2019-10-12 | Disposition: A | Discharge status: Home / Self Care | Payer: BLUE CROSS/BLUE SHIELD | Source: Ambulatory Visit | Attending: Orthopedic Surgery | Admitting: Orthopedic Surgery

## 2019-10-12 ENCOUNTER — Other Ambulatory Visit: Payer: Self-pay

## 2019-10-12 ENCOUNTER — Encounter (HOSPITAL_COMMUNITY)
Admission: RE | Admit: 2019-10-12 | Discharge: 2019-10-12 | Disposition: A | Discharge status: Home / Self Care | Payer: BLUE CROSS/BLUE SHIELD | Source: Ambulatory Visit | Attending: Orthopedic Surgery | Admitting: Orthopedic Surgery

## 2019-10-12 ENCOUNTER — Encounter (HOSPITAL_COMMUNITY): Payer: Self-pay

## 2019-10-12 DIAGNOSIS — S82201B Unspecified fracture of shaft of right tibia, initial encounter for open fracture type I or II: Secondary | ICD-10-CM | POA: Diagnosis not present

## 2019-10-12 DIAGNOSIS — Z01810 Encounter for preprocedural cardiovascular examination: Secondary | ICD-10-CM | POA: Diagnosis not present

## 2019-10-12 DIAGNOSIS — T84116A Breakdown (mechanical) of internal fixation device of bone of right lower leg, initial encounter: Secondary | ICD-10-CM | POA: Insufficient documentation

## 2019-10-12 DIAGNOSIS — Z20828 Contact with and (suspected) exposure to other viral communicable diseases: Secondary | ICD-10-CM | POA: Insufficient documentation

## 2019-10-12 DIAGNOSIS — Y838 Other surgical procedures as the cause of abnormal reaction of the patient, or of later complication, without mention of misadventure at the time of the procedure: Secondary | ICD-10-CM | POA: Diagnosis not present

## 2019-10-12 DIAGNOSIS — R001 Bradycardia, unspecified: Secondary | ICD-10-CM | POA: Diagnosis not present

## 2019-10-12 DIAGNOSIS — Z01812 Encounter for preprocedural laboratory examination: Secondary | ICD-10-CM | POA: Diagnosis present

## 2019-10-12 DIAGNOSIS — X58XXXA Exposure to other specified factors, initial encounter: Secondary | ICD-10-CM | POA: Diagnosis not present

## 2019-10-12 HISTORY — DX: Unspecified asthma, uncomplicated: J45.909

## 2019-10-12 HISTORY — DX: Anemia, unspecified: D64.9

## 2019-10-12 LAB — CBC
HCT: 40.6 % (ref 39.0–52.0)
Hemoglobin: 13 g/dL (ref 13.0–17.0)
MCH: 31.6 pg (ref 26.0–34.0)
MCHC: 32 g/dL (ref 30.0–36.0)
MCV: 98.5 fL (ref 80.0–100.0)
Platelets: 315 10*3/uL (ref 150–400)
RBC: 4.12 MIL/uL — ABNORMAL LOW (ref 4.22–5.81)
RDW: 15.1 % (ref 11.5–15.5)
WBC: 5.2 10*3/uL (ref 4.0–10.5)
nRBC: 0 % (ref 0.0–0.2)

## 2019-10-12 LAB — HEMOGLOBIN A1C
Hgb A1c MFr Bld: 7.9 % — ABNORMAL HIGH (ref 4.8–5.6)
Mean Plasma Glucose: 180.03 mg/dL

## 2019-10-12 LAB — BASIC METABOLIC PANEL
Anion gap: 9 (ref 5–15)
BUN: 10 mg/dL (ref 6–20)
CO2: 28 mmol/L (ref 22–32)
Calcium: 9.1 mg/dL (ref 8.9–10.3)
Chloride: 103 mmol/L (ref 98–111)
Creatinine, Ser: 1 mg/dL (ref 0.61–1.24)
GFR calc Af Amer: >60 mL/min (ref >60)
GFR calc non Af Amer: >60 mL/min (ref >60)
Glucose, Bld: 142 mg/dL — ABNORMAL HIGH (ref 70–99)
Potassium: 4.1 mmol/L (ref 3.5–5.1)
Sodium: 140 mmol/L (ref 135–145)

## 2019-10-12 LAB — GLUCOSE, CAPILLARY: Glucose-Capillary: 166 mg/dL — ABNORMAL HIGH (ref 70–99)

## 2019-10-12 NOTE — Progress Notes (Signed)
PCP - Dr. Jeanell Sparrow  In [facility] Cardiologist - na  PPM/ICD - na Device Orders -  Rep Notified -   Chest x-ray - na EKG - today Stress Test - na ECHO - na Cardiac Cath - na  Sleep Study - greater > 10 yrs. CPAP - na  Fasting Blood Sugar - 110-120 Checks Blood Sugar ____2_ times a day  Blood Thinner Instructions:na Aspirin Instructions:na  ERAS Protcol -na PRE-SURGERY Ensure or G2- na  COVID TEST- 10/12/19   Anesthesia review:   Patient denies shortness of breath, fever, cough and chest pain at PAT appointment   All instructions explained to the patient, with a verbal understanding of the material. Patient agrees to go over the instructions while at home for a better understanding. Patient also instructed to self quarantine after being tested for COVID-19. The opportunity to ask questions was provided.  Dr. Carloyn Manner

## 2019-10-13 LAB — NOVEL CORONAVIRUS, NAA (HOSP ORDER, SEND-OUT TO REF LAB; TAT 18-24 HRS): SARS-CoV-2, NAA: NOT DETECTED

## 2019-10-15 NOTE — H&P (Addendum)
Orthopaedic Trauma Service (OTS) Consult   Patient ID: Duane Collins MRN: [id number] DOB/AGE: 1961/03/26 58 y.o.    HPI: Duane Collins is an 58 y.o. male who sustained an open fracture to his right tibia September 2019.  He was referred to the orthopedic trauma service to address nonunion.  He was then taken back to the operating room on 03/28/2019 for exchange nailing.  He has been followed very closely in the outpatient setting.  He has had some improvement in his healing however has failed to show complete consolidation of his fracture and has continued pain over his distal locking bolts.  Patient presents today for removal of hardware and exchange nailing.   Past Medical History:  Diagnosis Date  . Anemia   . Anxiety   . Arthritis knee  . Asthma    no inhaler, only when over exerting self  . Diabetes mellitus without complication ([place])    Type II  . High cholesterol   . Hypertension   . Tibia/fibula fracture, shaft, right, open type I segmental 07/19/2018  . Vitamin D deficiency     Past Surgical History:  Procedure Laterality Date  . APPENDECTOMY    . HARDWARE REMOVAL Right 03/28/2019   Procedure: HARDWARE REMOVAL RIGHT TIBIA;  Surgeon: Altamese Marlboro, MD;  Location: [facility];  Service: Orthopedics;  Laterality: Right;  . IM NAILING TIBIA Right 03/28/2019  . KNEE ARTHROSCOPY Right    Menisus tear  . KNEE ARTHROSCOPY Left    "cleaned"  . TIBIA IM NAIL INSERTION Right 07/19/2018   Procedure: INTRAMEDULLARY (IM) NAIL TIBIAL;  Surgeon: Marchia Bond, MD;  Location: [facility];  Service: Orthopedics;  Laterality: Right;  . TIBIA IM NAIL INSERTION Right 03/28/2019   Procedure: INTRAMEDULLARY (IM) NAIL RIGHT TIBIAL;  Surgeon: Altamese Costilla, MD;  Location: [facility];  Service: Orthopedics;  Laterality: Right;    No family history on file.  Social History:  reports that he quit smoking about 20 years ago. He quit after 5.00 years of use. He has never used smokeless  tobacco. He reports previous alcohol use. He reports that he does not use drugs.  Allergies:  Allergies  Allergen Reactions  . Oxycodone Anxiety and Palpitations    Medications:  Current Meds  Medication Sig  . Camphor-Menthol-Methyl Sal (MUSCLE RUB ULTRA STRENGTH) 02-09-29 % CREA Apply 1 application topically as needed (muscle cramps).  . carvedilol (COREG) 3.125 MG tablet Take 3.125 mg by mouth 2 (two) times daily.  . Cholecalciferol (VITAMIN D3) 125 MCG (5000 UT) TABS Take 1 tablet (5,000 Units total) by mouth daily.  . citalopram (CELEXA) 40 MG tablet Take 40 mg by mouth daily.  . dapagliflozin propanediol (FARXIGA) 10 MG TABS tablet Take 10 mg by mouth daily.  Marland Kitchen docusate sodium (COLACE) 100 MG capsule Take 1 capsule (100 mg total) by mouth 2 (two) times daily.  . ferrous sulfate 325 (65 FE) MG tablet Take 325 mg by mouth once a week. Monday  . HYDROcodone-acetaminophen (NORCO/VICODIN) 5-325 MG tablet Take 1-2 tablets by mouth every 6 (six) hours as needed for moderate pain or severe pain.  . metFORMIN (GLUCOPHAGE) 1000 MG tablet Take 1,000 mg by mouth 2 (two) times daily with a meal.  . methocarbamol (ROBAXIN) 500 MG tablet Take 1-2 tablets (500-1,000 mg total) by mouth every 6 (six) hours as needed for muscle spasms.  . simvastatin (ZOCOR) 40 MG tablet Take 40 mg by mouth every evening.  . vitamin  C (VITAMIN C) 500 MG tablet Take 1 tablet (500 mg total) by mouth daily.  . Vitamin D, Ergocalciferol, (DRISDOL) 50000 units CAPS capsule Take 50,000 Units by mouth every Monday.      No results found for this or any previous visit (from the past 48 hour(s)).  No results found.  Review of Systems  Constitutional: Negative for chills and fever.  Respiratory: Negative for shortness of breath and wheezing.   Cardiovascular: Negative for chest pain and palpitations.  Gastrointestinal: Negative for abdominal pain, nausea and vomiting.  Musculoskeletal:       R leg pain   Neurological:  Negative for tingling and sensory change.   There were no vitals taken for this visit. Physical Exam Constitutional:      General: He is not in acute distress.    Appearance: Normal appearance.  Eyes:     Pupils: Pupils are equal, round, and reactive to light.  Cardiovascular:     Rate and Rhythm: Normal rate and regular rhythm.  Pulmonary:     Effort: Pulmonary effort is normal.     Breath sounds: Normal breath sounds.  Abdominal:     General: Bowel sounds are normal.     Palpations: Abdomen is soft.     Tenderness: There is no abdominal tenderness.  Musculoskeletal:     Comments: Right Lower extremity  Surgical wounds right lower leg are well-healed Minimal swelling Mild tenderness over the nonunion site Tenderness over locking bolts Excellent knee and ankle range of motion Extremities warm + DP pulse No deep calf tenderness No knee or ankle instability Distal motor and sensory functions are intact  Skin:    General: Skin is warm.     Capillary Refill: Capillary refill takes less than 2 seconds.  Neurological:     Mental Status: He is alert.  Psychiatric:        Attention and Perception: Attention and perception normal.        Mood and Affect: Mood normal.        Behavior: Behavior normal. Behavior is cooperative.     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  Anticipate minimal narcotic need  - FEN/GI prophylaxis/Foley/Lines:  Npo  - Dispo:  OR for removal of hardware and exchange nailing right tibia    Mearl Latin, PA-C [phone]  (C) 10/15/2019, 2:52 PM  [facility] [address] [place] [address] [phone] [phone] (F)   I have seen and examined the patient. I agree with the findings above.  I discussed with the patient the risks and benefits of surgery for right tibia hardware removal and exchange nailing without locking, including the possibility of infection, nerve injury, vessel injury, wound breakdown, arthritis, symptomatic hardware, DVT/ PE, loss of motion, malunion, nonunion, and need for further surgery among others. He acknowledged these risks and wished to proceed.   Budd Palmer, MD 10/16/2019 8:12 AM

## 2019-10-16 ENCOUNTER — Ambulatory Visit (HOSPITAL_COMMUNITY): Payer: BLUE CROSS/BLUE SHIELD

## 2019-10-16 ENCOUNTER — Inpatient Hospital Stay (HOSPITAL_COMMUNITY): Payer: No Typology Code available for payment source

## 2019-10-16 ENCOUNTER — Encounter (HOSPITAL_COMMUNITY)
Admission: RE | Disposition: A | Discharge status: Home / Self Care | Payer: Self-pay | Source: Home / Self Care | Attending: Orthopedic Surgery

## 2019-10-16 ENCOUNTER — Inpatient Hospital Stay (HOSPITAL_COMMUNITY)
Admission: RE | Admit: 2019-10-16 | Discharge: 2019-10-18 | DRG: 493 | Disposition: A | Discharge status: Home / Self Care | Payer: No Typology Code available for payment source | Attending: Orthopedic Surgery | Admitting: Orthopedic Surgery

## 2019-10-16 ENCOUNTER — Other Ambulatory Visit: Payer: Self-pay

## 2019-10-16 ENCOUNTER — Inpatient Hospital Stay (HOSPITAL_COMMUNITY): Payer: BLUE CROSS/BLUE SHIELD | Attending: Orthopedic Surgery

## 2019-10-16 ENCOUNTER — Encounter (HOSPITAL_COMMUNITY): Payer: Self-pay | Admitting: Orthopedic Surgery

## 2019-10-16 DIAGNOSIS — Z7984 Long term (current) use of oral hypoglycemic drugs: Secondary | ICD-10-CM | POA: Diagnosis not present

## 2019-10-16 DIAGNOSIS — D62 Acute posthemorrhagic anemia: Secondary | ICD-10-CM | POA: Diagnosis not present

## 2019-10-16 DIAGNOSIS — S82201K Unspecified fracture of shaft of right tibia, subsequent encounter for closed fracture with nonunion: Secondary | ICD-10-CM | POA: Diagnosis present

## 2019-10-16 DIAGNOSIS — E559 Vitamin D deficiency, unspecified: Secondary | ICD-10-CM | POA: Diagnosis present

## 2019-10-16 DIAGNOSIS — I10 Essential (primary) hypertension: Secondary | ICD-10-CM | POA: Diagnosis present

## 2019-10-16 DIAGNOSIS — Z87891 Personal history of nicotine dependence: Secondary | ICD-10-CM

## 2019-10-16 DIAGNOSIS — S82291K Other fracture of shaft of right tibia, subsequent encounter for closed fracture with nonunion: Secondary | ICD-10-CM

## 2019-10-16 DIAGNOSIS — E8889 Other specified metabolic disorders: Secondary | ICD-10-CM | POA: Diagnosis present

## 2019-10-16 DIAGNOSIS — Z79899 Other long term (current) drug therapy: Secondary | ICD-10-CM

## 2019-10-16 DIAGNOSIS — T8484XA Pain due to internal orthopedic prosthetic devices, implants and grafts, initial encounter: Secondary | ICD-10-CM | POA: Diagnosis present

## 2019-10-16 DIAGNOSIS — Y831 Surgical operation with implant of artificial internal device as the cause of abnormal reaction of the patient, or of later complication, without mention of misadventure at the time of the procedure: Secondary | ICD-10-CM | POA: Diagnosis present

## 2019-10-16 DIAGNOSIS — F419 Anxiety disorder, unspecified: Secondary | ICD-10-CM | POA: Diagnosis present

## 2019-10-16 DIAGNOSIS — E119 Type 2 diabetes mellitus without complications: Secondary | ICD-10-CM | POA: Diagnosis present

## 2019-10-16 DIAGNOSIS — Z885 Allergy status to narcotic agent status: Secondary | ICD-10-CM | POA: Diagnosis not present

## 2019-10-16 DIAGNOSIS — S82401M Unspecified fracture of shaft of right fibula, subsequent encounter for open fracture type I or II with nonunion: Secondary | ICD-10-CM | POA: Diagnosis present

## 2019-10-16 DIAGNOSIS — S82201M Unspecified fracture of shaft of right tibia, subsequent encounter for open fracture type I or II with nonunion: Secondary | ICD-10-CM | POA: Diagnosis present

## 2019-10-16 DIAGNOSIS — Z20828 Contact with and (suspected) exposure to other viral communicable diseases: Secondary | ICD-10-CM | POA: Diagnosis present

## 2019-10-16 DIAGNOSIS — E291 Testicular hypofunction: Secondary | ICD-10-CM | POA: Diagnosis present

## 2019-10-16 DIAGNOSIS — Z419 Encounter for procedure for purposes other than remedying health state, unspecified: Secondary | ICD-10-CM

## 2019-10-16 HISTORY — PX: TIBIA IM NAIL INSERTION: SHX2516

## 2019-10-16 HISTORY — PX: HARDWARE REMOVAL: SHX979

## 2019-10-16 LAB — C-REACTIVE PROTEIN: CRP: 0.6 mg/dL (ref <1.0)

## 2019-10-16 LAB — GLUCOSE, CAPILLARY
Glucose-Capillary: 146 mg/dL — ABNORMAL HIGH (ref 70–99)
Glucose-Capillary: 122 mg/dL — ABNORMAL HIGH (ref 70–99)
Glucose-Capillary: 281 mg/dL — ABNORMAL HIGH (ref 70–99)
Glucose-Capillary: 225 mg/dL — ABNORMAL HIGH (ref 70–99)

## 2019-10-16 LAB — SEDIMENTATION RATE: Sed Rate: 14 mm/hr (ref 0–16)

## 2019-10-16 SURGERY — REMOVAL, HARDWARE
Anesthesia: General | Site: Leg Lower | Laterality: Right

## 2019-10-16 MED ORDER — HYDROCODONE-ACETAMINOPHEN 5-325 MG PO TABS
1.0000 | ORAL_TABLET | ORAL | Status: DC | PRN
  Administered 2019-10-17 – 2019-10-18 (×5): 2 via ORAL
  Filled 2019-10-16 (×6): qty 2

## 2019-10-16 MED ORDER — MIDAZOLAM HCL 2 MG/2ML IJ SOLN
INTRAMUSCULAR | Status: AC
  Filled 2019-10-16: qty 2

## 2019-10-16 MED ORDER — METOCLOPRAMIDE HCL 5 MG PO TABS
5.0000 mg | ORAL_TABLET | Freq: Three times a day (TID) | ORAL | Status: DC | PRN
  Filled 2019-10-16: qty 2

## 2019-10-16 MED ORDER — METOCLOPRAMIDE HCL 5 MG/ML IJ SOLN: 5.0000 mg | Freq: Three times a day (TID) | INTRAMUSCULAR | Status: DC | PRN

## 2019-10-16 MED ORDER — ONDANSETRON HCL 4 MG/2ML IJ SOLN
4.0000 mg | Freq: Four times a day (QID) | INTRAMUSCULAR | Status: DC | PRN
  Administered 2019-10-17: 07:00:00 4 mg via INTRAVENOUS
  Filled 2019-10-16: qty 2

## 2019-10-16 MED ORDER — LIDOCAINE 2% (20 MG/ML) 5 ML SYRINGE
INTRAMUSCULAR | Status: AC
  Filled 2019-10-16: qty 5

## 2019-10-16 MED ORDER — CEFAZOLIN SODIUM-DEXTROSE 1-4 GM/50ML-% IV SOLN
1.0000 g | Freq: Four times a day (QID) | INTRAVENOUS | Status: AC
  Administered 2019-10-16 – 2019-10-17 (×3): 1 g via INTRAVENOUS
  Filled 2019-10-16 (×3): qty 50

## 2019-10-16 MED ORDER — CARVEDILOL 3.125 MG PO TABS
3.1250 mg | ORAL_TABLET | Freq: Two times a day (BID) | ORAL | Status: DC
  Administered 2019-10-16 – 2019-10-18 (×4): 3.125 mg via ORAL
  Filled 2019-10-16 (×4): qty 1

## 2019-10-16 MED ORDER — POTASSIUM CHLORIDE IN NACL 20-0.9 MEQ/L-% IV SOLN
INTRAVENOUS | Status: DC
  Administered 2019-10-16: 15:00:00 via INTRAVENOUS
  Filled 2019-10-16: qty 1000

## 2019-10-16 MED ORDER — METHOCARBAMOL 1000 MG/10ML IJ SOLN: 500.0000 mg | Freq: Three times a day (TID) | INTRAVENOUS | Status: DC

## 2019-10-16 MED ORDER — PROPOFOL 10 MG/ML IV BOLUS
INTRAVENOUS | Status: AC
  Filled 2019-10-16: qty 20

## 2019-10-16 MED ORDER — MIDAZOLAM HCL 5 MG/5ML IJ SOLN
INTRAMUSCULAR | Status: DC | PRN
  Administered 2019-10-16: 2 mg via INTRAVENOUS

## 2019-10-16 MED ORDER — DEXAMETHASONE SODIUM PHOSPHATE 10 MG/ML IJ SOLN
INTRAMUSCULAR | Status: AC
  Filled 2019-10-16: qty 1

## 2019-10-16 MED ORDER — CHLORHEXIDINE GLUCONATE 4 % EX LIQD: 60.0000 mL | Freq: Once | CUTANEOUS | Status: DC

## 2019-10-16 MED ORDER — METOCLOPRAMIDE HCL 5 MG/ML IJ SOLN: 10.0000 mg | Freq: Once | INTRAMUSCULAR | Status: DC | PRN

## 2019-10-16 MED ORDER — METHOCARBAMOL 500 MG PO TABS
750.0000 mg | ORAL_TABLET | Freq: Three times a day (TID) | ORAL | Status: DC
  Administered 2019-10-16 – 2019-10-18 (×6): 750 mg via ORAL
  Filled 2019-10-16 (×5): qty 2
  Filled 2019-10-16 (×2): qty 1
  Filled 2019-10-16: qty 2

## 2019-10-16 MED ORDER — PROPOFOL 10 MG/ML IV BOLUS
INTRAVENOUS | Status: AC
  Filled 2019-10-16: qty 40

## 2019-10-16 MED ORDER — CITALOPRAM HYDROBROMIDE 40 MG PO TABS
40.0000 mg | ORAL_TABLET | Freq: Every day | ORAL | Status: DC
  Administered 2019-10-16 – 2019-10-18 (×3): 40 mg via ORAL
  Filled 2019-10-16: qty 1
  Filled 2019-10-16: qty 2
  Filled 2019-10-16: qty 1
  Filled 2019-10-16: qty 2
  Filled 2019-10-16: qty 1
  Filled 2019-10-16: qty 2

## 2019-10-16 MED ORDER — MORPHINE SULFATE (PF) 2 MG/ML IV SOLN: 0.5000 mg | INTRAVENOUS | Status: DC | PRN

## 2019-10-16 MED ORDER — ONDANSETRON HCL 4 MG/2ML IJ SOLN
INTRAMUSCULAR | Status: DC | PRN
  Administered 2019-10-16: 4 mg via INTRAVENOUS

## 2019-10-16 MED ORDER — BUPIVACAINE-EPINEPHRINE 0.5% -1:200000 IJ SOLN
INTRAMUSCULAR | Status: AC
  Filled 2019-10-16: qty 1

## 2019-10-16 MED ORDER — DOCUSATE SODIUM 100 MG PO CAPS
100.0000 mg | ORAL_CAPSULE | Freq: Two times a day (BID) | ORAL | Status: DC
  Administered 2019-10-16 – 2019-10-18 (×5): 100 mg via ORAL
  Filled 2019-10-16 (×5): qty 1

## 2019-10-16 MED ORDER — ASCORBIC ACID 500 MG PO TABS
500.0000 mg | ORAL_TABLET | Freq: Every day | ORAL | Status: DC
  Administered 2019-10-17 – 2019-10-18 (×2): 500 mg via ORAL
  Filled 2019-10-16 (×3): qty 1

## 2019-10-16 MED ORDER — MEPERIDINE HCL 25 MG/ML IJ SOLN: 6.2500 mg | INTRAMUSCULAR | Status: DC | PRN

## 2019-10-16 MED ORDER — LIDOCAINE 2% (20 MG/ML) 5 ML SYRINGE
INTRAMUSCULAR | Status: DC | PRN
  Administered 2019-10-16: 100 mg via INTRAVENOUS

## 2019-10-16 MED ORDER — ONDANSETRON HCL 4 MG/2ML IJ SOLN
INTRAMUSCULAR | Status: AC
  Filled 2019-10-16: qty 2

## 2019-10-16 MED ORDER — LACTATED RINGERS IV SOLN: INTRAVENOUS | Status: DC

## 2019-10-16 MED ORDER — INSULIN ASPART 100 UNIT/ML ~~LOC~~ SOLN
0.0000 [IU] | Freq: Three times a day (TID) | SUBCUTANEOUS | Status: DC
  Administered 2019-10-16: 8 [IU] via SUBCUTANEOUS
  Administered 2019-10-17: 13:00:00 2 [IU] via SUBCUTANEOUS
  Administered 2019-10-17 (×2): 3 [IU] via SUBCUTANEOUS
  Administered 2019-10-18: 12:00:00 2 [IU] via SUBCUTANEOUS

## 2019-10-16 MED ORDER — PHENYLEPHRINE HCL-NACL 10-0.9 MG/250ML-% IV SOLN
INTRAVENOUS | Status: DC | PRN
  Administered 2019-10-16: 25 ug/min via INTRAVENOUS

## 2019-10-16 MED ORDER — ONDANSETRON HCL 4 MG PO TABS
4.0000 mg | ORAL_TABLET | Freq: Four times a day (QID) | ORAL | Status: DC | PRN
  Filled 2019-10-16: qty 1

## 2019-10-16 MED ORDER — ACETAMINOPHEN 325 MG PO TABS: 325.0000 mg | ORAL_TABLET | Freq: Four times a day (QID) | ORAL | Status: DC | PRN

## 2019-10-16 MED ORDER — PROPOFOL 10 MG/ML IV BOLUS
INTRAVENOUS | Status: DC | PRN
  Administered 2019-10-16: 200 mg via INTRAVENOUS

## 2019-10-16 MED ORDER — FENTANYL CITRATE (PF) 100 MCG/2ML IJ SOLN
25.0000 ug | INTRAMUSCULAR | Status: DC | PRN
  Administered 2019-10-16 (×2): 50 ug via INTRAVENOUS

## 2019-10-16 MED ORDER — VITAMIN D 25 MCG (1000 UNIT) PO TABS
1000.0000 [IU] | ORAL_TABLET | Freq: Every day | ORAL | Status: DC
  Administered 2019-10-16 – 2019-10-18 (×3): 1000 [IU] via ORAL
  Filled 2019-10-16 (×3): qty 1

## 2019-10-16 MED ORDER — 0.9 % SODIUM CHLORIDE (POUR BTL) OPTIME
TOPICAL | Status: DC | PRN
  Administered 2019-10-16: 08:00:00 1000 mL

## 2019-10-16 MED ORDER — POLYETHYLENE GLYCOL 3350 17 G PO PACK: 17.0000 g | PACK | Freq: Every day | ORAL | Status: DC | PRN

## 2019-10-16 MED ORDER — HYDROCODONE-ACETAMINOPHEN 7.5-325 MG PO TABS
1.0000 | ORAL_TABLET | ORAL | Status: DC | PRN
  Administered 2019-10-16: 2 via ORAL
  Administered 2019-10-16: 1 via ORAL
  Administered 2019-10-16 – 2019-10-17 (×2): 2 via ORAL
  Filled 2019-10-16 (×4): qty 2

## 2019-10-16 MED ORDER — FENTANYL CITRATE (PF) 250 MCG/5ML IJ SOLN
INTRAMUSCULAR | Status: AC
  Filled 2019-10-16: qty 5

## 2019-10-16 MED ORDER — FENTANYL CITRATE (PF) 100 MCG/2ML IJ SOLN
INTRAMUSCULAR | Status: DC | PRN
  Administered 2019-10-16: 150 ug via INTRAVENOUS
  Administered 2019-10-16: 75 ug via INTRAVENOUS
  Administered 2019-10-16: 25 ug via INTRAVENOUS

## 2019-10-16 MED ORDER — FENTANYL CITRATE (PF) 100 MCG/2ML IJ SOLN
INTRAMUSCULAR | Status: AC
  Filled 2019-10-16: qty 2

## 2019-10-16 MED ORDER — ROCURONIUM BROMIDE 10 MG/ML (PF) SYRINGE
PREFILLED_SYRINGE | INTRAVENOUS | Status: AC
  Filled 2019-10-16: qty 10

## 2019-10-16 MED ORDER — SUGAMMADEX SODIUM 200 MG/2ML IV SOLN
INTRAVENOUS | Status: DC | PRN
  Administered 2019-10-16: 200 mg via INTRAVENOUS

## 2019-10-16 MED ORDER — CEFAZOLIN SODIUM-DEXTROSE 2-4 GM/100ML-% IV SOLN
2.0000 g | INTRAVENOUS | Status: AC
  Administered 2019-10-16: 08:00:00 2 g via INTRAVENOUS
  Filled 2019-10-16: qty 100

## 2019-10-16 MED ORDER — BUPIVACAINE-EPINEPHRINE 0.5% -1:200000 IJ SOLN
INTRAMUSCULAR | Status: DC | PRN
  Administered 2019-10-16: 20 mL

## 2019-10-16 MED ORDER — DEXAMETHASONE SODIUM PHOSPHATE 10 MG/ML IJ SOLN
INTRAMUSCULAR | Status: DC | PRN
  Administered 2019-10-16: 5 mg via INTRAVENOUS

## 2019-10-16 MED ORDER — LACTATED RINGERS IV SOLN
INTRAVENOUS | Status: DC | PRN
  Administered 2019-10-16: 07:00:00 via INTRAVENOUS

## 2019-10-16 MED ORDER — ROCURONIUM BROMIDE 10 MG/ML (PF) SYRINGE
PREFILLED_SYRINGE | INTRAVENOUS | Status: DC | PRN
  Administered 2019-10-16: 100 mg via INTRAVENOUS

## 2019-10-16 SURGICAL SUPPLY — 51 items
BLADE SURG 10 STRL SS (BLADE) ×3 IMPLANT
BNDG COHESIVE 6X5 TAN STRL LF (GAUZE/BANDAGES/DRESSINGS) ×3 IMPLANT
BNDG ELASTIC 4X5.8 VLCR STR LF (GAUZE/BANDAGES/DRESSINGS) ×3 IMPLANT
BNDG ELASTIC 6X5.8 VLCR STR LF (GAUZE/BANDAGES/DRESSINGS) ×3 IMPLANT
BRUSH SCRUB EZ PLAIN DRY (MISCELLANEOUS) ×12 IMPLANT
COVER SURGICAL LIGHT HANDLE (MISCELLANEOUS) ×3 IMPLANT
COVER WAND RF STERILE (DRAPES) ×3 IMPLANT
DRAPE C-ARM 42X72 X-RAY (DRAPES) ×3 IMPLANT
DRAPE C-ARMOR (DRAPES) ×3 IMPLANT
DRAPE U-SHAPE 47X51 STRL (DRAPES) ×3 IMPLANT
DRSG EMULSION OIL 3X3 NADH (GAUZE/BANDAGES/DRESSINGS) ×6 IMPLANT
ELECT REM PT RETURN 9FT ADLT (ELECTROSURGICAL) ×3
ELECTRODE REM PT RTRN 9FT ADLT (ELECTROSURGICAL) ×1 IMPLANT
GAUZE SPONGE 4X4 12PLY STRL (GAUZE/BANDAGES/DRESSINGS) ×3 IMPLANT
GLOVE BIO SURGEON STRL SZ7.5 (GLOVE) ×3 IMPLANT
GLOVE BIO SURGEON STRL SZ8 (GLOVE) ×3 IMPLANT
GLOVE BIO SURGEON STRL SZ8.5 (GLOVE) ×3 IMPLANT
GLOVE BIOGEL PI IND STRL 7.5 (GLOVE) ×1 IMPLANT
GLOVE BIOGEL PI IND STRL 8 (GLOVE) ×1 IMPLANT
GLOVE BIOGEL PI INDICATOR 7.5 (GLOVE) ×2
GLOVE BIOGEL PI INDICATOR 8 (GLOVE) ×2
GOWN STRL REUS W/ TWL LRG LVL3 (GOWN DISPOSABLE) ×2 IMPLANT
GOWN STRL REUS W/ TWL XL LVL3 (GOWN DISPOSABLE) ×1 IMPLANT
GOWN STRL REUS W/TWL LRG LVL3 (GOWN DISPOSABLE) ×4
GOWN STRL REUS W/TWL XL LVL3 (GOWN DISPOSABLE) ×2
GUIDEPIN 3.2X17.5 THRD DISP (PIN) ×3 IMPLANT
GUIDEWIRE BALL NOSE 80CM (WIRE) ×3 IMPLANT
KIT BASIN OR (CUSTOM PROCEDURE TRAY) ×3 IMPLANT
KIT TURNOVER KIT B (KITS) ×3 IMPLANT
MANIFOLD NEPTUNE II (INSTRUMENTS) ×3 IMPLANT
NAIL TIBIA 13X37.5 (Nail) ×3 IMPLANT
NS IRRIG 1000ML POUR BTL (IV SOLUTION) ×3 IMPLANT
PACK ORTHO EXTREMITY (CUSTOM PROCEDURE TRAY) ×3 IMPLANT
PAD ABD 8X10 STRL (GAUZE/BANDAGES/DRESSINGS) ×3 IMPLANT
PAD CAST 4YDX4 CTTN HI CHSV (CAST SUPPLIES) ×1 IMPLANT
PADDING CAST COTTON 4X4 STRL (CAST SUPPLIES) ×2
PADDING CAST COTTON 6X4 STRL (CAST SUPPLIES) ×3 IMPLANT
SPONGE LAP 18X18 RF (DISPOSABLE) ×3 IMPLANT
STAPLER VISISTAT 35W (STAPLE) ×3 IMPLANT
STOCKINETTE IMPERVIOUS LG (DRAPES) ×3 IMPLANT
SUT VIC AB 0 CT1 27 (SUTURE) ×2
SUT VIC AB 0 CT1 27XBRD ANBCTR (SUTURE) ×1 IMPLANT
SUT VIC AB 2-0 CT1 27 (SUTURE) ×4
SUT VIC AB 2-0 CT1 TAPERPNT 27 (SUTURE) ×2 IMPLANT
TOWEL GREEN STERILE (TOWEL DISPOSABLE) ×6 IMPLANT
TOWEL GREEN STERILE FF (TOWEL DISPOSABLE) ×6 IMPLANT
TUBE CONNECTING 12'X1/4 (SUCTIONS) ×1
TUBE CONNECTING 12X1/4 (SUCTIONS) ×2 IMPLANT
UNDERPAD 30X30 (UNDERPADS AND DIAPERS) ×3 IMPLANT
WATER STERILE IRR 1000ML POUR (IV SOLUTION) ×3 IMPLANT
YANKAUER SUCT BULB TIP NO VENT (SUCTIONS) ×3 IMPLANT

## 2019-10-16 NOTE — Op Note (Signed)
10/16/2019  11:07 AM  PATIENT:  Duane Collins  58 y.o. male  PRE-OPERATIVE DIAGNOSIS:   1. NONUNION RIGHT TIBIAL SHAFT S/P OPEN FRACTURE 2. SYMPTOMATIC HARDWARE RIGHT TIBIA  POST-OPERATIVE DIAGNOSIS:   1. NONUNION RIGHT TIBIAL SHAFT S/P OPEN FRACTURE 2. SYMPTOMATIC AND BROKEN HARDWARE RIGHT TIBIA  PROCEDURE:  Procedure(s): 1. RIGHT TIBIA INTRAMEDULLARY NAILING WITH BIOMET VERSANAIL 13 X 375 MM, UNLOCKED 2. HARDWARE REMOVAL RIGHT TIBIA (Right)  SURGEON:  Surgeon(s) and Role:    Myrene Galas, MD - Primary  ASSISTANTS: none   ANESTHESIA:   general  EBL:  50 mL   BLOOD ADMINISTERED:none  DRAINS: none   LOCAL MEDICATIONS USED:  MARCAINE     SPECIMEN:  Source of Specimen:  Reamings right tibia  DISPOSITION OF SPECIMEN:  micro  COUNTS:  YES  TOURNIQUET:  * No tourniquets in log *  DICTATION: .Note written in EPIC  PLAN OF CARE: Admit to inpatient   PATIENT DISPOSITION:  PACU - hemodynamically stable.   Delay start of Pharmacological VTE agent (>24hrs) due to surgical blood loss or risk of bleeding: no  BRIEF SUMMARY AND INDICATIONS FOR PROCEDURE:  Duane Collins is a 58 y.o. who sustained an open tibia fracture from Sep 2019, complicated by nonuion which improved but persisted after exchange nailing. Patient continues to report pain of implants particularly at the ankle and CT scan shows failure of complete consolidation. I also discussed with the patient the risks and benefits of surgery, including the possibility of infection, nerve injury, vessel injury, wound breakdown, arthritis, symptomatic hardware, DVT/ PE, loss of motion, malunion, persistent nonunion, heart attack, stroke, prolonged intubation, and need for further surgery among others. These risks were acknowledged and consent given to proceed.  BRIEF SUMMARY OF PROCEDURE:  The patient was taken to the operating room after administration of Ancef for antibiotics.  The operative extremity was prepped  and draped in the usual fashion.  No tourniquet was used during the procedure.  I identified the old screws with x-ray and used small incisions to remove the two proximal ones but extension of the distal locking bolt site was required as the most distal screw was loose and threads stripped. We found that it was broken. I then had to take the starting guidewire, invert it, and tap out the far side of the screw laterally, eventually extracting it through yet another small lateral incision.   We could then turn our attention to the nonunion. A 2.5-cm incision was made at the base of the distal pole of patella and extended proximally. Because of the initial surgeon's decision to approach through a lateral  Parapatellar retinacular incision, this was selected as well. Once it was made, the guide wire was advanced into the head of the old nail and the extraction bolt engaged and nail withdrawn. A guidewire was then advanced across the fracture site into the middle of the plafond and checked on AP and LAT images, measuring for nail length on the lateral.  We then performed sequential reaming, encountering chatter at 13 mm, reaming up to 14 mm and placing a 13 x 375 mm nail. No changes in alignment occurred and no locking bolts were placed in accordance with our preoperative plan. An excellent reduction was maintained throughout. Standard layered closure was performed. The patient was taken to the PACU in stable condition after application of sterile gently compressive dressings.  PROGNOSIS:  The patient will be weightbearing as tolerated with unrestricted motion of the knee and  ankle. CAM boot for support if needed in early postoperative period. Lovenox in hospital for DVT prophylaxis. F/u in the office in 10-14 days for removal of sutures.     Astrid Divine. Marcelino Scot, M.D.

## 2019-10-16 NOTE — Transfer of Care (Signed)
Immediate Anesthesia Transfer of Care Note  Patient: Duane Collins  Procedure(s) Performed: HARDWARE REMOVAL RIGHT TIBIA (Right Leg Lower) RIGHT INTRAMEDULLARY TIBIAL NAIL EXCHANGE (Right Leg Lower)  Patient Location: PACU  Anesthesia Type:General  Level of Consciousness: awake, alert  and oriented  Airway & Oxygen Therapy: Patient Spontanous Breathing and Patient connected to nasal cannula oxygen  Post-op Assessment: Report given to RN, Post -op Vital signs reviewed and stable and Patient moving all extremities X 4  Post vital signs: Reviewed and stable  Last Vitals:  Vitals Value Taken Time  BP 110/70 10/16/19 1048  Temp    Pulse 50 10/16/19 1050  Resp 13 10/16/19 1050  SpO2 100 % 10/16/19 1050  Vitals shown include unvalidated device data.  Last Pain:  Vitals:   10/16/19 1046  PainSc: 6          Complications: No apparent anesthesia complications

## 2019-10-16 NOTE — Anesthesia Preprocedure Evaluation (Addendum)
Anesthesia Evaluation  Patient identified by MRN, date of birth, ID band Patient awake    Reviewed: Allergy & Precautions, NPO status , Patient's Chart, lab work & pertinent test results  Airway Mallampati: II  TM Distance: >3 FB Neck ROM: Full    Dental no notable dental hx. (+) Teeth Intact   Pulmonary former smoker,    Pulmonary exam normal breath sounds clear to auscultation       Cardiovascular hypertension, Normal cardiovascular exam Rhythm:Regular Rate:Normal     Neuro/Psych negative neurological ROS  negative psych ROS   GI/Hepatic negative GI ROS, Neg liver ROS,   Endo/Other  diabetes, Type 2  Renal/GU negative Renal ROS  negative genitourinary   Musculoskeletal negative musculoskeletal ROS (+)   Abdominal   Peds negative pediatric ROS (+)  Hematology negative hematology ROS (+)   Anesthesia Other Findings   Reproductive/Obstetrics negative OB ROS                            Anesthesia Physical Anesthesia Plan  ASA: II  Anesthesia Plan: General   Post-op Pain Management:    Induction: Intravenous  PONV Risk Score and Plan: 2 and Ondansetron, Dexamethasone and Treatment may vary due to age or medical condition  Airway Management Planned: LMA and Oral ETT  Additional Equipment:   Intra-op Plan:   Post-operative Plan: Extubation in OR  Informed Consent: I have reviewed the patients History and Physical, chart, labs and discussed the procedure including the risks, benefits and alternatives for the proposed anesthesia with the patient or authorized representative who has indicated his/her understanding and acceptance.     Dental advisory given  Plan Discussed with: CRNA  Anesthesia Plan Comments:         Anesthesia Quick Evaluation

## 2019-10-16 NOTE — Plan of Care (Signed)
Spoke with ortho PA [employer]- notified him about pt bleeding through dressing, no new orders, just to reinforce as needed, apple ice & elevate  Problem: Education: Goal: Knowledge of General Education information will improve Description: Including pain rating scale, medication(s)/side effects and non-pharmacologic comfort measures Outcome: Progressing   Problem: Clinical Measurements: Goal: Respiratory complications will improve Outcome: Progressing Note: Room air   Problem: Activity: Goal: Risk for activity intolerance will decrease Outcome: Progressing   Problem: Nutrition: Goal: Adequate nutrition will be maintained Outcome: Progressing   Problem: Coping: Goal: Level of anxiety will decrease Outcome: Progressing   Problem: Elimination: Goal: Will not experience complications related to urinary retention Outcome: Progressing   Problem: Pain Managment: Goal: General experience of comfort will improve Outcome: Progressing Note: Treated for pain twice with PRN NORCO   Problem: Safety: Goal: Ability to remain free from injury will improve Outcome: Progressing

## 2019-10-16 NOTE — Anesthesia Procedure Notes (Signed)
Procedure Name: Intubation Date/Time: 10/16/2019 8:26 AM Performed by: Kyung Rudd, CRNA Pre-anesthesia Checklist: Patient identified, Emergency Drugs available, Suction available and Patient being monitored Patient Re-evaluated:Patient Re-evaluated prior to induction Oxygen Delivery Method: Circle system utilized Preoxygenation: Pre-oxygenation with 100% oxygen Induction Type: IV induction Ventilation: Mask ventilation without difficulty and Oral airway inserted - appropriate to patient size Laryngoscope Size: Mac and 4 Grade View: Grade I Tube type: Oral Tube size: 7.5 mm Number of attempts: 1 Airway Equipment and Method: Stylet Placement Confirmation: ETT inserted through vocal cords under direct vision,  positive ETCO2 and breath sounds checked- equal and bilateral Secured at: 22 cm Tube secured with: Tape Dental Injury: Teeth and Oropharynx as per pre-operative assessment

## 2019-10-16 NOTE — Progress Notes (Signed)
Orthopedic Tech Progress Note Patient Details:  [name] 16-Jul-1961 [id number] I applied Over Head Frame with Trapeze for patient. Patient ID: Duane Collins, male   DOB: [birth year], 58 y.o.   MRN: [id number]   [name] 10/16/2019, 2:39 PM

## 2019-10-16 NOTE — Brief Op Note (Signed)
10/16/2019  11:07 AM  PATIENT:  Duane Collins  58 y.o. male  PRE-OPERATIVE DIAGNOSIS:   1. NONUNION RIGHT TIBIAL SHAFT S/P OPEN FRACTURE 2. SYMPTOMATIC HARDWARE RIGHT TIBIA  POST-OPERATIVE DIAGNOSIS:   1. NONUNION RIGHT TIBIAL SHAFT S/P OPEN FRACTURE 2. SYMPTOMATIC AND BROKEN HARDWARE RIGHT TIBIA  PROCEDURE:  Procedure(s): 1. RIGHT TIBIA INTRAMEDULLARY NAILING WITH BIOMET VERSANAIL 13 X 375 MM, UNLOCKED 2. HARDWARE REMOVAL RIGHT TIBIA (Right)  SURGEON:  Surgeon(s) and Role:    Altamese Anadarko, MD - Primary  ASSISTANTS: none   ANESTHESIA:   general  EBL:  50 mL   BLOOD ADMINISTERED:none  DRAINS: none   LOCAL MEDICATIONS USED:  MARCAINE     SPECIMEN:  Source of Specimen:  Reamings right tibia  DISPOSITION OF SPECIMEN:  micro  COUNTS:  YES  TOURNIQUET:  * No tourniquets in log *  DICTATION: .Note written in EPIC  PLAN OF CARE: Admit to inpatient   PATIENT DISPOSITION:  PACU - hemodynamically stable.   Delay start of Pharmacological VTE agent (>24hrs) due to surgical blood loss or risk of bleeding: no

## 2019-10-17 ENCOUNTER — Encounter: Payer: Self-pay | Admitting: *Deleted

## 2019-10-17 LAB — CBC
HCT: 34.4 % — ABNORMAL LOW (ref 39.0–52.0)
Hemoglobin: 11.5 g/dL — ABNORMAL LOW (ref 13.0–17.0)
MCH: 32.1 pg (ref 26.0–34.0)
MCHC: 33.4 g/dL (ref 30.0–36.0)
MCV: 96.1 fL (ref 80.0–100.0)
Platelets: 297 10*3/uL (ref 150–400)
RBC: 3.58 MIL/uL — ABNORMAL LOW (ref 4.22–5.81)
RDW: 14.9 % (ref 11.5–15.5)
WBC: 9.3 10*3/uL (ref 4.0–10.5)
nRBC: 0 % (ref 0.0–0.2)

## 2019-10-17 LAB — GLUCOSE, CAPILLARY
Glucose-Capillary: 183 mg/dL — ABNORMAL HIGH (ref 70–99)
Glucose-Capillary: 147 mg/dL — ABNORMAL HIGH (ref 70–99)
Glucose-Capillary: 172 mg/dL — ABNORMAL HIGH (ref 70–99)
Glucose-Capillary: 195 mg/dL — ABNORMAL HIGH (ref 70–99)

## 2019-10-17 LAB — VITAMIN D 25 HYDROXY (VIT D DEFICIENCY, FRACTURES): Vit D, 25-Hydroxy: 27.63 ng/mL — ABNORMAL LOW (ref 30–100)

## 2019-10-17 MED ORDER — ALUM & MAG HYDROXIDE-SIMETH 200-200-20 MG/5ML PO SUSP
30.0000 mL | Freq: Four times a day (QID) | ORAL | Status: DC | PRN
  Administered 2019-10-17: 08:00:00 30 mL via ORAL
  Filled 2019-10-17: qty 30

## 2019-10-17 MED ORDER — CALCIUM CARBONATE ANTACID 500 MG PO CHEW
1.0000 | CHEWABLE_TABLET | Freq: Two times a day (BID) | ORAL | Status: DC
  Administered 2019-10-17 – 2019-10-18 (×3): 200 mg via ORAL
  Filled 2019-10-17 (×3): qty 1

## 2019-10-17 MED ORDER — METOCLOPRAMIDE HCL 10 MG PO TABS
10.0000 mg | ORAL_TABLET | Freq: Three times a day (TID) | ORAL | Status: DC
  Administered 2019-10-17 – 2019-10-18 (×5): 10 mg via ORAL
  Filled 2019-10-17 (×5): qty 1

## 2019-10-17 MED ORDER — PANTOPRAZOLE SODIUM 40 MG PO TBEC
40.0000 mg | DELAYED_RELEASE_TABLET | Freq: Every day | ORAL | Status: DC
  Administered 2019-10-17 – 2019-10-18 (×2): 40 mg via ORAL
  Filled 2019-10-17 (×2): qty 1

## 2019-10-17 NOTE — Plan of Care (Signed)
  Problem: Education: Goal: Knowledge of General Education information will improve Description: Including pain rating scale, medication(s)/side effects and non-pharmacologic comfort measures Outcome: Progressing   Problem: Clinical Measurements: Goal: Respiratory complications will improve Outcome: Progressing Note: On room air   Problem: Activity: Goal: Risk for activity intolerance will decrease Outcome: Progressing Note: Up to chair most of the day, walked in hallway with PT   Problem: Pain Managment: Goal: General experience of comfort will improve Outcome: Progressing Note: Treated pain twice with Vicodin pain stayed controlled around 4-5   Problem: Safety: Goal: Ability to remain free from injury will improve Outcome: Progressing   Problem: Activity: Goal: Ability to increase mobility will improve Outcome: Progressing

## 2019-10-17 NOTE — Anesthesia Postprocedure Evaluation (Signed)
Anesthesia Post Note  Patient: CHARLTON BOULE  Procedure(s) Performed: HARDWARE REMOVAL RIGHT TIBIA (Right Leg Lower) RIGHT INTRAMEDULLARY TIBIAL NAIL EXCHANGE (Right Leg Lower)     Patient location during evaluation: PACU Anesthesia Type: General Level of consciousness: awake and alert Pain management: pain level controlled Vital Signs Assessment: post-procedure vital signs reviewed and stable Respiratory status: spontaneous breathing, nonlabored ventilation, respiratory function stable and patient connected to nasal cannula oxygen Cardiovascular status: blood pressure returned to baseline and stable Postop Assessment: no apparent nausea or vomiting Anesthetic complications: no    Last Vitals:  Vitals:   10/17/19 0303 10/17/19 0751  BP: 134/81 139/82  Pulse: 61 66  Resp:  18  Temp: (!) 36.4 C 36.6 C  SpO2: 97% 100%    Last Pain:  Vitals:   10/17/19 0751  TempSrc: Oral  PainSc: 4                  Montez Hageman

## 2019-10-17 NOTE — Evaluation (Addendum)
Physical Therapy Evaluation Patient Details Name: Duane Collins MRN: [id number] DOB: 03-08-1961 Today's Date: 10/17/2019   History of Present Illness  Duane Collins is an 58 y.o. male who sustained an open fracture to his right tibia September 2019.  He was referred to the orthopedic trauma service to address nonunion.  He was then taken back to the operating room on 03/28/2019 for exchange nailing.  He has failed to show complete consolidation of his fracture and has continued pain over his distal locking bolts. Pt is s/p tibia IM nailing and removal of hardware (10/16/19).  Clinical Impression  Pt admitted with above diagnosis. Pt currently with functional limitations due to the deficits listed below (see PT Problem List). Pt will benefit from skilled PT to increase their independence and safety with mobility to allow discharge to the venue listed below.  Pt is moving well overall and is familiar with use of RW.  He didn't have orders for a CAM boot, but is requesting one.  A sticky note was left in chart for MD, as well as discussed with nurse and pt. He has all other DME.     Follow Up Recommendations Follow surgeon's recommendation for DC plan and follow-up therapies    Equipment Recommendations  None recommended by PT    Recommendations for Other Services       Precautions / Restrictions Precautions Precautions: None Precaution Comments: Pt requesting CAM boot- Note to MD Restrictions RLE Weight Bearing: Weight bearing as tolerated      Mobility  Bed Mobility               General bed mobility comments: Pt up in recliner upon arrival  Transfers Overall transfer level: Needs assistance Equipment used: Rolling walker (2 wheeled) Transfers: Sit to/from Stand Sit to Stand: Min guard         General transfer comment: good use of hands  Ambulation/Gait Ambulation/Gait assistance: Min guard Gait Distance (Feet): 85 Feet Assistive device: Rolling walker (2  wheeled) Gait Pattern/deviations: Decreased stance time - right;Antalgic Gait velocity: decreased   General Gait Details: Antalgic gait, but good sequencing and only required min/guard A with use of RW.  Stairs            Wheelchair Mobility    Modified Rankin (Stroke Patients Only)       Balance Overall balance assessment: Needs assistance         Standing balance support: During functional activity Standing balance-Leahy Scale: Fair Standing balance comment: Pt able to stand at toilet to urinate, but needs UE support for dynamic movement                             Pertinent Vitals/Pain Pain Assessment: 0-10 Pain Score: 6  Pain Location: R ankle and knee Pain Descriptors / Indicators: Sharp Pain Intervention(s): Limited activity within patient's tolerance;Monitored during session;Repositioned(declined ice or pain meds)    Home Living Family/patient expects to be discharged to:: Private residence Living Arrangements: Spouse/significant other Available Help at Discharge: Family;Available 24 hours/day Type of Home: House Home Access: Stairs to enter   [employer] of Steps: 6 w/ rail in front (3 no rail in garage) Home Layout: One level Home Equipment: Walker - 2 wheels;Crutches;Bedside commode;Wheelchair - manual      Prior Function Level of Independence: Independent               Hand Dominance   Dominant Hand: Right  Extremity/Trunk Assessment   Upper Extremity Assessment Upper Extremity Assessment: Defer to OT evaluation    Lower Extremity Assessment Lower Extremity Assessment: RLE deficits/detail RLE Deficits / Details: Limited knee and ankle ROM due to discomfort RLE: Unable to fully assess due to pain       Communication   Communication: No difficulties  Cognition Arousal/Alertness: Awake/alert Behavior During Therapy: WFL for tasks assessed/performed Overall Cognitive Status: Within Functional Limits for  tasks assessed                                        General Comments      Exercises General Exercises - Lower Extremity Ankle Circles/Pumps: AROM;Right;10 reps   Assessment/Plan    PT Assessment Patient needs continued PT services  PT Problem List Decreased strength;Decreased range of motion;Decreased activity tolerance;Decreased mobility;Pain       PT Treatment Interventions DME instruction;Gait training;Stair training;Functional mobility training;Therapeutic activities;Therapeutic exercise;Patient/family education    PT Goals (Current goals can be found in the Care Plan section)  Acute Rehab PT Goals Patient Stated Goal: Get CAM boot for support around ankle PT Goal Formulation: With patient Time For Goal Achievement: 10/31/19 Potential to Achieve Goals: Good    Frequency Min 5X/week   Barriers to discharge        Co-evaluation               AM-PAC PT "6 Clicks" Mobility  Outcome Measure Help needed turning from your back to your side while in a flat bed without using bedrails?: A Little Help needed moving from lying on your back to sitting on the side of a flat bed without using bedrails?: A Little Help needed moving to and from a bed to a chair (including a wheelchair)?: A Little Help needed standing up from a chair using your arms (e.g., wheelchair or bedside chair)?: A Little Help needed to walk in hospital room?: A Little Help needed climbing 3-5 steps with a railing? : A Little 6 Click Score: 18    End of Session Equipment Utilized During Treatment: Gait belt Activity Tolerance: Patient tolerated treatment well Patient left: in chair;with call bell/phone within reach Nurse Communication: Mobility status;Other (comment)(Pt asking for CAM boot) PT Visit Diagnosis: Difficulty in walking, not elsewhere classified (R26.2);Pain Pain - Right/Left: Right Pain - part of body: Leg    Time: 1202-1226 PT Time Calculation (min) (ACUTE ONLY):  24 min   Charges:   PT Evaluation $PT Eval Low Complexity: 1 Low PT Treatments $Gait Training: 8-22 mins        Venancio Chenier L. Tamala Julian, [place] Pager [phone] 10/17/2019   Galen Manila 10/17/2019, 1:12 PM

## 2019-10-17 NOTE — Evaluation (Signed)
Occupational Therapy Evaluation Patient Details Name: Duane Collins MRN: [id number] DOB: [birth year] Today's Date: 10/17/2019    History of Present Illness Duane Collins is an 58 y.o. male who sustained an open fracture to his right tibia September 2019.  He was referred to the orthopedic trauma service to address nonunion.  He was then taken back to the operating room on 03/28/2019 for exchange nailing.  He has failed to show complete consolidation of his fracture and has continued pain over his distal locking bolts. Pt is s/p tibia IM nailing and removal of hardware (10/16/19).   Clinical Impression   Pt with decline in function and safety with ADLs and ADL mobility with impaired strength and balance. Pt with hx of R tibial injury with surgery 07/2018. Pt live sat home with his wife and was independent with ADLs/selfcare and mobility. Pt currently requires mod A with LB ADLs, min A with toileting and min guard A with mobility using RW. Pt would benefit from acute OT services to address impairments to maximize level of function and safety    Follow Up Recommendations  Follow surgeon's recommendation for DC plan and follow-up therapies;Supervision - Intermittent    Equipment Recommendations  None recommended by OT    Recommendations for Other Services       Precautions / Restrictions Precautions Precautions: None Precaution Comments: Pt requesting CAM boot- Note to MD Restrictions Weight Bearing Restrictions: Yes RLE Weight Bearing: Weight bearing as tolerated      Mobility Bed Mobility Overal bed mobility: Needs Assistance Bed Mobility: Supine to Sit     Supine to sit: Supervision     General bed mobility comments: Pt up in recliner upon arrival  Transfers Overall transfer level: Needs assistance Equipment used: Rolling walker (2 wheeled) Transfers: Sit to/from Stand Sit to Stand: Min guard         General transfer comment: good use of hands    Balance  Overall balance assessment: Needs assistance Sitting-balance support: Feet supported Sitting balance-Leahy Scale: Good     Standing balance support: During functional activity;Bilateral upper extremity supported;Single extremity supported Standing balance-Leahy Scale: Fair Standing balance comment: Pt able to stand at toilet to urinate, but needs UE support for dynamic movement                           ADL either performed or assessed with clinical judgement   ADL Overall ADL's : Needs assistance/impaired Eating/Feeding: Independent;Sitting   Grooming: Wash/dry hands;Wash/dry face;Min guard;Standing   Upper Body Bathing: Set up;Independent;Sitting   Lower Body Bathing: Moderate assistance   Upper Body Dressing : Set up;Independent;Sitting   Lower Body Dressing: Moderate assistance   Toilet Transfer: Min guard;Ambulation;RW;Cueing for safety   Toileting- Clothing Manipulation and Hygiene: Minimal assistance;Sit to/from stand   Tub/ Shower Transfer: Min guard;Ambulation;Rolling walker;3 in 1   Functional mobility during ADLs: Min guard;Rolling walker;Cueing for safety       Vision Patient Visual Report: No change from baseline       Perception     Praxis      Pertinent Vitals/Pain Pain Assessment: 0-10 Pain Score: 6  Pain Location: R ankle and knee Pain Descriptors / Indicators: Sharp Pain Intervention(s): Monitored during session;Premedicated before session;Repositioned     Hand Dominance Right   Extremity/Trunk Assessment Upper Extremity Assessment Upper Extremity Assessment: Overall WFL for tasks assessed   Lower Extremity Assessment Lower Extremity Assessment: Defer to PT evaluation RLE Deficits / Details:  Limited knee and ankle ROM due to discomfort RLE: Unable to fully assess due to pain   Cervical / Trunk Assessment Cervical / Trunk Assessment: Normal   Communication Communication Communication: No difficulties   Cognition  Arousal/Alertness: Awake/alert Behavior During Therapy: WFL for tasks assessed/performed Overall Cognitive Status: Within Functional Limits for tasks assessed                                     General Comments       Exercises General Exercises - Lower Extremity Ankle Circles/Pumps: AROM;Right;10 reps   Shoulder Instructions      Home Living Family/patient expects to be discharged to:: Private residence Living Arrangements: Spouse/significant other Available Help at Discharge: Family;Available 24 hours/day Type of Home: House Home Access: Stairs to enter [employer] of Steps: 6 w/ rail in front (3 no rail in garage)   Home Layout: One level     Bathroom Shower/Tub: Tub/shower unit;Walk-in shower;Door   [employer] Toilet: Standard     Home Equipment: Environmental consultant - 2 wheels;Crutches;Bedside commode;Wheelchair - manual          Prior Functioning/Environment Level of Independence: Independent                 OT Problem List: Impaired balance (sitting and/or standing);Pain;Decreased activity tolerance      OT Treatment/Interventions: Self-care/ADL training;DME and/or AE instruction;Therapeutic activities;Patient/family education    OT Goals(Current goals can be found in the care plan section) Acute Rehab OT Goals Patient Stated Goal: Get CAM boot for support around ankle OT Goal Formulation: With patient Time For Goal Achievement: 10/31/19 ADL Goals Pt Will Perform Grooming: with supervision;with set-up;with modified independence;standing Pt Will Perform Lower Body Bathing: with min assist;with min guard assist;sit to/from stand;with caregiver independent in assisting Pt Will Perform Lower Body Dressing: with min assist;with min guard assist;sit to/from stand;with caregiver independent in assisting Pt Will Transfer to Toilet: with supervision;with modified independence;ambulating Pt Will Perform Toileting - Clothing Manipulation and hygiene:  with min guard assist;with supervision;sit to/from stand  OT Frequency: Min 2X/week   Barriers to D/C:    no barriers       Co-evaluation              AM-PAC OT "6 Clicks" Daily Activity     Outcome Measure Help from another person eating meals?: None Help from another person taking care of personal grooming?: A Little Help from another person toileting, which includes using toliet, bedpan, or urinal?: A Little Help from another person bathing (including washing, rinsing, drying)?: A Lot Help from another person to put on and taking off regular upper body clothing?: None Help from another person to put on and taking off regular lower body clothing?: A Lot 6 Click Score: 18   End of Session Equipment Utilized During Treatment: Gait belt;Rolling walker;Other (comment)(3 in 1)  Activity Tolerance: Patient tolerated treatment well Patient left: in chair;with call bell/phone within reach  OT Visit Diagnosis: Other abnormalities of gait and mobility (R26.89);Muscle weakness (generalized) (M62.81);Pain Pain - Right/Left: Right Pain - part of body: Leg                Time: 0921-0949 OT Time Calculation (min): 28 min Charges:  OT General Charges $OT Visit: 1 Visit OT Evaluation $OT Eval Low Complexity: 1 Low OT Treatments $Self Care/Home Management : 8-22 mins    Britt Bottom 10/17/2019, 2:14 PM

## 2019-10-17 NOTE — Progress Notes (Signed)
Orthopedic Tech Progress Note Patient Details:  [name] 02-11-1961 [id number] After I sized up CAM WALKER BOOT on patient I removed it and sat it off to the side on the counter.[name] Devices Type of Ortho Device: CAM walker Ortho Device/Splint Location: LRE Ortho Device/Splint Interventions: Application, Ordered   Post Interventions Patient Tolerated: Well Instructions Provided: Care of device, Adjustment of device   [name] 10/17/2019, 4:09 PM

## 2019-10-17 NOTE — Progress Notes (Addendum)
Orthopaedic Trauma Service Progress Note  Patient ID: ERVEY FALLIN MRN: [id number] DOB/AGE: [birth year] 56 y.o.  Subjective:  Doing fair Moderate pain in R knee   Would like cam boot for support  Moderate drainage post op from open holes in tibia from old screw sites    Review of Systems  Constitutional: Negative for chills and fever.  Respiratory: Negative for shortness of breath and wheezing.   Cardiovascular: Negative for chest pain and palpitations.  Gastrointestinal: Negative for nausea and vomiting.  Neurological: Negative for tingling.    Objective:   VITALS:   Vitals:   10/16/19 2356 10/17/19 0303 10/17/19 0751 10/17/19 1500  BP: (!) 149/81 134/81 139/82 (!) 152/76  Pulse: 67 61 66 62  Resp:   18 17  Temp: (!) 97.5 F (36.4 C) (!) 97.5 F (36.4 C) 97.8 F (36.6 C) 98.5 F (36.9 C)  TempSrc: Oral Oral Oral   SpO2: 94% 97% 100% 94%  Weight:      Height:        Estimated body mass index is 36.79 kg/m as calculated from the following:   Height as of this encounter: 5\' 9"  (1.753 m).   Weight as of this encounter: 113 kg.   Intake/Output      12/14 0701 - 12/15 0700 12/15 0701 - 12/16 0700   P.O. 1200 360   I.V. (mL/kg) 832.8 (7.4) 54.4 (0.5)   IV Piggyback 250    Total Intake(mL/kg) 2282.8 (20.2) 414.4 (3.7)   Urine (mL/kg/hr) 1300 (0.5) 1125 (1.1)   Emesis/NG output  0   Blood 50    Total Output 1350 1125   Net +932.8 -710.6        Emesis Occurrence  1 x     LABS  Results for orders placed or performed during the hospital encounter of 10/16/19 (from the past 24 hour(s))  Glucose, capillary     Status: Abnormal   Collection Time: 10/16/19  4:13 PM  Result Value Ref Range   Glucose-Capillary 281 (H) 70 - 99 mg/dL  Glucose, capillary     Status: Abnormal   Collection Time: 10/16/19  9:16 PM  Result Value Ref Range   Glucose-Capillary 225 (H) 70 - 99 mg/dL  VITAMIN  D 25 Hydroxy (Vit-D Deficiency, Fractures)     Status: Abnormal   Collection Time: 10/17/19  2:57 AM  Result Value Ref Range   Vit D, 25-Hydroxy 27.63 (L) 30 - 100 ng/mL  CBC     Status: Abnormal   Collection Time: 10/17/19  2:57 AM  Result Value Ref Range   WBC 9.3 4.0 - 10.5 K/uL   RBC 3.58 (L) 4.22 - 5.81 MIL/uL   Hemoglobin 11.5 (L) 13.0 - 17.0 g/dL   HCT 34.4 (L) 39.0 - 52.0 %   MCV 96.1 80.0 - 100.0 fL   MCH 32.1 26.0 - 34.0 pg   MCHC 33.4 30.0 - 36.0 g/dL   RDW 14.9 11.5 - 15.5 %   Platelets 297 150 - 400 K/uL   nRBC 0.0 0.0 - 0.2 %  Glucose, capillary     Status: Abnormal   Collection Time: 10/17/19  6:27 AM  Result Value Ref Range   Glucose-Capillary 183 (H) 70 - 99 mg/dL  Glucose, capillary     Status: Abnormal   Collection  Time: 10/17/19 11:58 AM  Result Value Ref Range   Glucose-Capillary 147 (H) 70 - 99 mg/dL     PHYSICAL EXAM:   Gen: ambulatory around room with walker, doing well  Lungs: unlabored Cardiac:reg Ext:       Right Lower Extremity   Dressing stable  Has been reinforced around ankle  Distal motor and sensory functions grossly intact  Ext warm   Swelling stable   Assessment/Plan: 1 Day Post-Op   Principal Problem:   Tibia and fibula open fracture, right, type I or II, with nonunion, subsequent encounter Active Problems:   Hypertension   Anxiety   Vitamin D deficiency   Hypogonadism in male   Painful orthopaedic hardware (HCC)   Fracture of tibia and fibula, shaft, right, open type I or II, with nonunion, subsequent encounter   Anti-infectives (From admission, onward)   Start     Dose/Rate Route Frequency Ordered Stop   10/16/19 1500  ceFAZolin (ANCEF) IVPB 1 g/50 mL premix     1 g 100 mL/hr over 30 Minutes Intravenous Every 6 hours 10/16/19 1307 10/17/19 0251   10/16/19 0600  ceFAZolin (ANCEF) IVPB 2g/100 mL premix     2 g 200 mL/hr over 30 Minutes Intravenous On call to O.R. 10/16/19 3734 10/16/19 0843    .  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 ? Compliance with DM management    This has likely contributed to continued nonunion     Vitamin d deficiency    Continue supplementation    Levels look better than last admission   - DVT/PE prophylaxis:  Mobilize  scds  - ID:   periop abx  - Metabolic Bone Disease:  Continue vitamin d   - Activity:  WBAT with assistance   - FEN/GI prophylaxis/Foley/Lines:  Carb mod diet   - Dispo:  Dc home tomorrow      Mearl Latin, PA-C [phone] (C) 10/17/2019, 3:46 PM  [facility] [address] [place] [address] [phone] [phone] (F)   After 6pm on weekdays please call office number to get in touch with on call provider or refer to Amion and look to see who is on call for the Sports Medicine Call Group which is listed under orthopaedics   On Weekends please call office number to get in touch with on call provider or refer to Amion and look to see who is on call for the Sports Medicine Call Group which is listed under orthopaedics

## 2019-10-17 NOTE — Progress Notes (Signed)
Pt complaining of hiccups and really bad heartburn this morning, sitting on side of bed, night shift RN gave zofran, has not helped. Spoke with [facility], he placed new orders from [facility], Maalox, reglan, protonix. Will give and continue to monitor.  Marijean Heath, Therapist, sports, BSN

## 2019-10-18 LAB — GLUCOSE, CAPILLARY
Glucose-Capillary: 111 mg/dL — ABNORMAL HIGH (ref 70–99)
Glucose-Capillary: 148 mg/dL — ABNORMAL HIGH (ref 70–99)

## 2019-10-18 MED ORDER — DULOXETINE HCL 30 MG PO CPEP
30.0000 mg | ORAL_CAPSULE | Freq: Every day | ORAL | Status: DC
  Administered 2019-10-18: 12:00:00 30 mg via ORAL
  Filled 2019-10-18: qty 1

## 2019-10-18 MED ORDER — HYDROCODONE-ACETAMINOPHEN 7.5-325 MG PO TABS: 1.0000 | ORAL_TABLET | Freq: Four times a day (QID) | ORAL | 0 refills | Status: DC | PRN

## 2019-10-18 MED ORDER — METHOCARBAMOL 500 MG PO TABS: 500.0000 mg | ORAL_TABLET | Freq: Four times a day (QID) | ORAL | 0 refills | Status: DC | PRN

## 2019-10-18 MED ORDER — DULOXETINE HCL 30 MG PO CPEP: 30.0000 mg | ORAL_CAPSULE | Freq: Every day | ORAL | 1 refills | Status: DC

## 2019-10-18 NOTE — Discharge Instructions (Signed)
[facility] Discharge Instructions   General Discharge Instructions   WEIGHT BEARING STATUS: Weightbearing as tolerated in cam boot  RANGE OF MOTION/ACTIVITY: Unrestricted range of motion right knee and ankle.  Activity as tolerated.  Increase activity slowly  Bone health: Continue to take vitamin D.  Your vitamin D levels are improved since last visit.  Please also follow-up with your regular family doctor regarding your diabetes management as your hemoglobin A1c levels are worse from last admission  Wound Care: Daily wound care starting on 10/21/2019.  Please see instructions below.  Okay to leave wounds open to the air once there is no more drainage  Discharge Wound Care Instructions  Do NOT apply any ointments, solutions or lotions to pin sites or surgical wounds.  These prevent needed drainage and even though solutions like hydrogen peroxide kill bacteria, they also damage cells lining the pin sites that help fight infection.  Applying lotions or ointments can keep the wounds moist and can cause them to breakdown and open up as well. This can increase the risk for infection. When in doubt call the office.  Surgical incisions should be dressed daily.  If any drainage is noted, use one layer of adaptic, then gauze, Kerlix, and an ace wrap.  Once the incision is completely dry and without drainage, it may be left open to air out.  Showering may begin 36-48 hours later.  Cleaning gently with soap and water.  Traumatic wounds should be dressed daily as well.    One layer of adaptic, gauze, Kerlix, then ace wrap.  The adaptic can be discontinued once the draining has ceased    If you have a wet to dry dressing: wet the gauze with saline the squeeze as much saline out so the gauze is moist (not soaking wet), place moistened gauze over wound, then place a dry gauze over the moist one, followed by Kerlix wrap, then ace wrap.   Diet: as you were eating previously.  Can  use over the counter stool softeners and bowel preparations, such as Miralax, to help with bowel movements.  Narcotics can be constipating.  Be sure to drink plenty of fluids  PAIN MEDICATION USE AND EXPECTATIONS  You have likely been given narcotic medications to help control your pain.  After a traumatic event that results in an fracture (broken bone) with or without surgery, it is ok to use narcotic pain medications to help control one's pain.  We understand that everyone responds to pain differently and each individual patient will be evaluated on a regular basis for the continued need for narcotic medications. Ideally, narcotic medication use should last no more than 6-8 weeks (coinciding with fracture healing).   As a patient it is your responsibility as well to monitor narcotic medication use and report the amount and frequency you use these medications when you come to your office visit.   We would also advise that if you are using narcotic medications, you should take a dose prior to therapy to maximize you participation.  IF YOU ARE ON NARCOTIC MEDICATIONS IT IS NOT PERMISSIBLE TO OPERATE A MOTOR VEHICLE (MOTORCYCLE/CAR/TRUCK/MOPED) OR HEAVY MACHINERY DO NOT MIX NARCOTICS WITH OTHER CNS (CENTRAL NERVOUS SYSTEM) DEPRESSANTS SUCH AS ALCOHOL   STOP SMOKING OR USING NICOTINE PRODUCTS!!!!  As discussed nicotine severely impairs your body's ability to heal surgical and traumatic wounds but also impairs bone healing.  Wounds and bone heal by forming microscopic blood vessels (angiogenesis) and nicotine is a vasoconstrictor (essentially,  shrinks blood vessels).  Therefore, if vasoconstriction occurs to these microscopic blood vessels they essentially disappear and are unable to deliver necessary nutrients to the healing tissue.  This is one modifiable factor that you can do to dramatically increase your chances of healing your injury.    (This means no smoking, no nicotine gum, patches, etc)  DO NOT  USE NONSTEROIDAL ANTI-INFLAMMATORY DRUGS (NSAID'S)  Using products such as Advil (ibuprofen), Aleve (naproxen), Motrin (ibuprofen) for additional pain control during fracture healing can delay and/or prevent the healing response.  If you would like to take over the counter (OTC) medication, Tylenol (acetaminophen) is ok.  However, some narcotic medications that are given for pain control contain acetaminophen as well. Therefore, you should not exceed more than 4000 mg of tylenol in a day if you do not have liver disease.  Also note that there are may OTC medicines, such as cold medicines and allergy medicines that my contain tylenol as well.  If you have any questions about medications and/or interactions please ask your doctor/PA or your pharmacist.      ICE AND ELEVATE INJURED/OPERATIVE EXTREMITY  Using ice and elevating the injured extremity above your heart can help with swelling and pain control.  Icing in a pulsatile fashion, such as 20 minutes on and 20 minutes off, can be followed.    Do not place ice directly on skin. Make sure there is a barrier between to skin and the ice pack.    Using frozen items such as frozen peas works well as the conform nicely to the are that needs to be iced.  USE AN ACE WRAP OR TED HOSE FOR SWELLING CONTROL  In addition to icing and elevation, Ace wraps or TED hose are used to help limit and resolve swelling.  It is recommended to use Ace wraps or TED hose until you are informed to stop.    When using Ace Wraps start the wrapping distally (farthest away from the body) and wrap proximally (closer to the body)   Example: If you had surgery on your leg or thing and you do not have a splint on, start the ace wrap at the toes and work your way up to the thigh        If you had surgery on your upper extremity and do not have a splint on, start the ace wrap at your fingers and work your way up to the upper arm  IF YOU ARE IN A SPLINT OR CAST DO NOT [place]   If your splint gets wet for any reason please contact the office immediately. You may shower in your splint or cast as long as you keep it dry.  This can be done by wrapping in a cast cover or garbage back (or similar)  Do Not stick any thing down your splint or cast such as pencils, money, or hangers to try and scratch yourself with.  If you feel itchy take benadryl as prescribed on the bottle for itching  IF YOU ARE IN A CAM BOOT (BLACK BOOT)  You may remove boot periodically. Perform daily dressing changes as noted below.  Wash the liner of the boot regularly and wear a sock when wearing the boot. It is recommended that you sleep in the boot until told otherwise    Call office for the following:  Temperature greater than 101F  Persistent nausea and vomiting  Severe uncontrolled pain  Redness, tenderness, or signs of infection (  pain, swelling, redness, odor or green/yellow discharge around the site)  Difficulty breathing, headache or visual disturbances  Hives  Persistent dizziness or light-headedness  Extreme fatigue  Any other questions or concerns you may have after discharge  In an emergency, call 911 or go to an Emergency Department at a nearby hospital    CALL THE OFFICE WITH ANY QUESTIONS OR CONCERNS: [phone]   VISIT OUR WEBSITE FOR ADDITIONAL INFORMATION: [email or web address]

## 2019-10-18 NOTE — Plan of Care (Signed)
  Problem: Clinical Measurements: Goal: Ability to maintain clinical measurements within normal limits will improve Outcome: Progressing   Problem: Activity: Goal: Risk for activity intolerance will decrease Outcome: Progressing   Problem: Elimination: Goal: Will not experience complications related to bowel motility Outcome: Progressing   Problem: Pain Managment: Goal: General experience of comfort will improve Outcome: Progressing   Problem: Safety: Goal: Ability to remain free from injury will improve Outcome: Progressing   Problem: Skin Integrity: Goal: Risk for impaired skin integrity will decrease Outcome: Progressing   

## 2019-10-18 NOTE — Discharge Summary (Signed)
Orthopaedic Trauma Service (OTS) Discharge Summary   Patient ID: Duane Collins MRN: [id number] DOB/AGE: [birth year] 56 y.o.  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 Painful hardware right tibia Hypertension Anxiety Vitamin D deficiency Hypogonadism  Discharge Diagnoses:  Principal Problem:   Tibia and fibula open fracture, right, type I or II, with nonunion, subsequent encounter Active Problems:   Hypertension   Anxiety   Vitamin D deficiency   Hypogonadism in male   Painful orthopaedic hardware (HCC)   Fracture of tibia and fibula, shaft, right, open type I or II, with nonunion, subsequent encounter   Past Medical History:  Diagnosis Date   Anemia    Anxiety    Arthritis knee   Asthma    no inhaler, only when over exerting self   Diabetes mellitus without complication (HCC)    Type II   High cholesterol    Hypertension    Tibia/fibula fracture, shaft, right, open type I segmental 07/19/2018   Vitamin D deficiency      Procedures Performed:  10/16/2019-Dr. Carola Frost  1. RIGHT TIBIA INTRAMEDULLARY NAILING WITH BIOMET VERSANAIL 13 X 375 MM, UNLOCKED 2. HARDWARE REMOVAL RIGHT TIBIA (Right)  Discharged Condition: good  Hospital Course:   Patient is a 58 year old black male well-known to the orthopedic trauma service for nonunion of his open right tibia and fibula fracture that he sustained while at work.  Patient has undergone numerous procedures.  He was taken back to the operating room on 10/16/2019 for repeat exchange nailing.  He was still having some symptoms at his nonunion site despite his x-rays as showing near the complete consolidation.  He was also having pain over his locking bolts.  Patient taken back to the operating room for the procedure noted above.  After surgery he was transferred to the PACU for recovery from anesthesia and then transported  to the orthopedic floor for observation, pain control and therapies.  Patient's hospital stay was relatively uncomplicated really the only issue he had was pain control.  Total stay was 2 postoperative days.  He did require a fair amount of IV pain medications on postoperative day #1 and did have a fair amount of bleeding which was expected given the open old screw holes into the tibia.  Patient to continue to progress well over the next 24 to 36 hours and was deemed to be stable for discharge to home on 10/18/2019.  At the time of discharge patient was voiding without difficulty, mobilizing with a walker and a cam boot.  He was bearing weight through his right leg.  Tolerating diet.  Of note his hemoglobin A1c on this admission is about 1 percentage point worse than it was back in May 2020.  He is instructed to follow-up with his primary care doctor as this does indicate his control is been suboptimal.  He continues to claim that he has been overly stressed which I believe is the same thing that he stated the last time we checked his A1c.  His worsening A1c is likely contributed to his persistent pain in his nonunion site.  Patient follow-up with orthopedics in 10 to 14 days for suture removal and follow-up x-rays.  He is mobilizing well enough and I do not feel that he needs pharmacologic DVT prophylaxis at discharge.  Vitamin D levels are also improved from previous evaluation  Consults: None  Significant Diagnostic Studies: labs:   Results for Duane Collins (  MRN [id number]) as of 10/18/2019 09:55  Ref. Range 10/16/2019 06:26 10/17/2019 02:57  CRP Latest Ref Range: <1.0 mg/dL 0.6   Vitamin D, 25-Hydroxy Latest Ref Range: 30 - 100 ng/mL  27.63 (L)  WBC Latest Ref Range: 4.0 - 10.5 K/uL  9.3  RBC Latest Ref Range: 4.22 - 5.81 MIL/uL  3.58 (L)  Hemoglobin Latest Ref Range: 13.0 - 17.0 g/dL  11.5 (L)  HCT Latest Ref Range: 39.0 - 52.0 %  34.4 (L)  MCV Latest Ref Range: 80.0 - 100.0 fL  96.1    MCH Latest Ref Range: 26.0 - 34.0 pg  32.1  MCHC Latest Ref Range: 30.0 - 36.0 g/dL  33.4  RDW Latest Ref Range: 11.5 - 15.5 %  14.9  Platelets Latest Ref Range: 150 - 400 K/uL  297  nRBC Latest Ref Range: 0.0 - 0.2 %  0.0  Sed Rate Latest Ref Range: 0 - 16 mm/hr 14    Results for Duane Collins (MRN [id number]) as of 10/18/2019 09:55  Ref. Range 10/12/2019 11:20  Hemoglobin A1C Latest Ref Range: 4.8 - 5.6 % 7.9 (H)    Treatments: IV hydration, antibiotics: Ancef, analgesia: acetaminophen, Dilaudid and norco, therapies: PT, OT and RN and surgery: as above   Discharge Exam:              Orthopaedic Trauma Service Progress Note   Patient ID: Duane Collins MRN: [id number] DOB/AGE: [birth year] 58 y.o.   Subjective:   Doing well, no new issues, ready to go home Pain is moderate Mobilizing well   States that he checks his sugars twice a day and he will be compliant with his regimen.  Was supposed to follow-up with his PCP in [place] this week   His A1c indicates that his control is suboptimal   ROS As above Objective:    VITALS:         Vitals:    10/17/19 1500 10/17/19 1946 10/18/19 0348 10/18/19 0750  BP: (!) 152/76 (!) 142/72 (!) 142/72 138/73  Pulse: 62 68 63 63  Resp: 17 19 18 18   Temp: 98.5 F (36.9 C) 98.4 F (36.9 C) 98.3 F (36.8 C) 98.4 F (36.9 C)  TempSrc:   Oral Oral Oral  SpO2: 94% 99% 96% 97%  Weight:          Height:              Estimated body mass index is 36.79 kg/m as calculated from the following:   Height as of this encounter: 5\' 9"  (1.753 m).   Weight as of this encounter: 113 kg.     Intake/Output      12/15 0701 - 12/16 0700 12/16 0701 - 12/17 0700   P.O. 960    I.V. (mL/kg) 54.4 (0.5)    IV Piggyback     Total Intake(mL/kg) 1014.4 (9)    Urine (mL/kg/hr) 2325 (0.9)    Emesis/NG output 0    Blood     Total Output 2325    Net -1310.6         Emesis Occurrence 1 x       LABS   Lab Results Last 24 Hours        Results for orders placed or performed during the hospital encounter of 10/16/19 (from the past 24 hour(s))  Glucose, capillary     Status: Abnormal    Collection Time: 10/17/19 11:58 AM  Result Value Ref Range    Glucose-Capillary 147 (H)  70 - 99 mg/dL  Glucose, capillary     Status: Abnormal    Collection Time: 10/17/19  4:28 PM  Result Value Ref Range    Glucose-Capillary 172 (H) 70 - 99 mg/dL  Glucose, capillary     Status: Abnormal    Collection Time: 10/17/19  8:18 PM  Result Value Ref Range    Glucose-Capillary 195 (H) 70 - 99 mg/dL  Glucose, capillary     Status: Abnormal    Collection Time: 10/18/19  6:41 AM  Result Value Ref Range    Glucose-Capillary 111 (H) 70 - 99 mg/dL          PHYSICAL EXAM:    Gen: Resting comfortably in bed, no acute distress, appears well Lungs: Breathing is unlabored, clear Cardiac: Regular rate and rhythm, S1 and S2 Abd: Soft, nontender, nondistended Ext:       Right lower extremity             Dressing removed, incisions look fantastic and are dry, no active drainage             Extremity is warm             Swelling is stable             No deep calf tenderness             No pain out of proportion with passive stretching             + DP pulse             DPN, SPN, TN sensory functions intact             EHL, FHL, lesser toe motor function intact             Ankle flexion, extension, inversion and eversion are intact   Assessment/Plan: 2 Days Post-Op    Principal Problem:   Tibia and fibula open fracture, right, type I or II, with nonunion, subsequent encounter Active Problems:   Hypertension   Anxiety   Vitamin D deficiency   Hypogonadism in male   Painful orthopaedic hardware (HCC)   Fracture of tibia and fibula, shaft, right, open type I or II, with nonunion, subsequent encounter                Anti-infectives (From admission, onward)      Start     Dose/Rate Route Frequency Ordered Stop    10/16/19 1500   ceFAZolin  (ANCEF) IVPB 1 g/50 mL premix     1 g 100 mL/hr over 30 Minutes Intravenous Every 6 hours 10/16/19 1307 10/17/19 0251    10/16/19 0600   ceFAZolin (ANCEF) IVPB 2g/100 mL premix     2 g 200 mL/hr over 30 Minutes Intravenous On call to O.Collins. 10/16/19 9528 10/16/19 0843       .   POD/HD#: 2   58 y/o male with persistent nonunion Collins tibia/fibula due to open fracture   -Collins tibia and fibula persistent nonunion s/p repeat exchange nailing             WBAT Collins leg with assistance              Ok to WB in CAM boot for support             Unrestricted ROM Collins knee and ankle             Ice and elevate  DC home today                - Pain management:            Discharge home with Norco 7.5/325, Robaxin and Cymbalta for nerve pain             Ice               - ABL anemia/Hemodynamics             Stable   - Medical issues              DM                         SSI                          Restart home meds at dc                                                  His A1c back in 03/2019 was 1% lower than this admission. ? Compliance with DM management                          This has likely contributed to continued nonunion                          Follow-up with PCP                          Vitamin d deficiency                          Continue supplementation                          Levels look better than last admission    - DVT/PE prophylaxis:             Mobilize             scds   - ID:              periop abx completed   - Metabolic Bone Disease:             Continue vitamin d    - Activity:             WBAT with assistance    - FEN/GI prophylaxis/Foley/Lines:             Carb mod diet    - Dispo:            DC home today             Follow-up with orthopedics in 10 to 14 days             Follow-up with PCP in 10 to 14 days as well regarding diabetes management     Disposition: Discharge disposition: 01-Home or Self Care       Discharge  Instructions    Call MD / Call 911   Complete by: As directed    If you experience chest pain or shortness of breath, CALL 911 and be transported  to the hospital emergency room.  If you develope a fever above 101 F, pus (white drainage) or increased drainage or redness at the wound, or calf pain, call your surgeon's office.   Constipation Prevention   Complete by: As directed    Drink plenty of fluids.  Prune juice may be helpful.  You may use a stool softener, such as Colace (over the counter) 100 mg twice a day.  Use MiraLax (over the counter) for constipation as needed.   Diet Carb Modified   Complete by: As directed    Driving restrictions   Complete by: As directed    No driving until you are off of your pain medicine   Increase activity slowly as tolerated   Complete by: As directed    Weight bearing as tolerated   Complete by: As directed    WBAT Collins leg in cam boot for comfort   Laterality: right   Extremity: Lower     Allergies as of 10/18/2019      Reactions   Oxycodone Anxiety, Palpitations      Medication List    STOP taking these medications   HYDROcodone-acetaminophen 5-325 MG tablet Commonly known as: NORCO/VICODIN Replaced by: HYDROcodone-acetaminophen 7.5-325 MG tablet     TAKE these medications   ascorbic acid 500 MG tablet Commonly known as: VITAMIN C Take 1 tablet (500 mg total) by mouth daily.   carvedilol 3.125 MG tablet Commonly known as: COREG Take 3.125 mg by mouth 2 (two) times daily.   citalopram 40 MG tablet Commonly known as: CELEXA Take 40 mg by mouth daily.   docusate sodium 100 MG capsule Commonly known as: COLACE Take 1 capsule (100 mg total) by mouth 2 (two) times daily.   DULoxetine 30 MG capsule Commonly known as: CYMBALTA Take 1 capsule (30 mg total) by mouth daily.   Farxiga 10 MG Tabs tablet Generic drug: dapagliflozin propanediol Take 10 mg by mouth daily.   ferrous sulfate 325 (65 FE) MG tablet Take 325 mg by mouth  once a week. Monday   HYDROcodone-acetaminophen 7.5-325 MG tablet Commonly known as: NORCO Take 1-2 tablets by mouth every 6 (six) hours as needed for severe pain. Replaces: HYDROcodone-acetaminophen 5-325 MG tablet   metFORMIN 1000 MG tablet Commonly known as: GLUCOPHAGE Take 1,000 mg by mouth 2 (two) times daily with a meal.   methocarbamol 500 MG tablet Commonly known as: ROBAXIN Take 1-2 tablets (500-1,000 mg total) by mouth every 6 (six) hours as needed for muscle spasms.   Muscle Rub Ultra Strength 02-09-29 % Crea Generic drug: Camphor-Menthol-Methyl Sal Apply 1 application topically as needed (muscle cramps).   simvastatin 40 MG tablet Commonly known as: ZOCOR Take 40 mg by mouth every evening.   Vitamin D (Ergocalciferol) 1.25 MG (50000 UT) Caps capsule Commonly known as: DRISDOL Take 50,000 Units by mouth every Monday.   Vitamin D3 125 MCG (5000 UT) Tabs Take 1 tablet (5,000 Units total) by mouth daily.            Discharge Care Instructions  (From admission, onward)         Start     Ordered   10/18/19 0000  Weight bearing as tolerated    Comments: WBAT Collins leg in cam boot for comfort  Question Answer Comment  Laterality right   Extremity Lower      10/18/19 0948         Follow-up Information    Myrene GalasHandy, Michael, MD. Schedule an appointment as  soon as possible for a visit in 2 week(s).   Specialty: Orthopedic Surgery Contact information: [address] [place] [address] [phone]        Lise Auer, MD. Schedule an appointment as soon as possible for a visit in 2 day(s).   Specialty: Family Medicine Why: follow up with PCP in 2 weeks to review diabetes management  Contact information: [address] [place] [address] [phone]           Discharge Instructions and Plan:  Patient will be weightbearing as tolerated on his right leg with his cam boot and crutches were walker.  He has unrestricted range of motion of  his right knee and ankle.  He is instructed to change his dressing in another 3 days.  Once there is no drainage he can leave open to the air.  Continue with aggressive ice and elevation as well as range of motion.  He will continue with his vitamin D supplementation  He needs to be better about his sugar control and is instructed to follow-up with his primary care physician.  I do feel that this has a direct impact on his nonunion predicament  Patient will be on a short course of narcotic medications for pain control.  I would anticipate him not needing any more medications in about 2 to 3 weeks.  He will continue with the Cymbalta for neuropathic pain and we can hopefully begin titrating off of this as well  Patient will follow-up with orthopedics in the outpatient setting in 10 to 14 days.  He is encouraged to contact the office for questions or concerns.  Comprehensive wound care instructions are included in his discharge paperwork.  And I did review this with the patient personally.  Signed:  Mearl Latin, PA-C [phone] (C) 10/18/2019, 9:51 AM  [facility] [address] [place] [address] [phone])

## 2019-10-18 NOTE — Progress Notes (Signed)
Physical Therapy Treatment Patient Details Name: STOCKTON NUNLEY MRN: [id number] DOB: 1961-05-11 Today's Date: 10/18/2019    History of Present Illness TYJAE ISSA is an 58 y.o. male who sustained an open fracture to his right tibia September 2019.  He was referred to the orthopedic trauma service to address nonunion.  He was then taken back to the operating room on 03/28/2019 for exchange nailing.  He has failed to show complete consolidation of his fracture and has continued pain over his distal locking bolts. Pt is s/p tibia IM nailing and removal of hardware (10/16/19).    PT Comments    Pt in bed upon PT arrival, agreeable to PT session with focus on mobility progression and stair training to facilitate d/c home. The pt was able to demo good bed mobility and transfers, as he did not need any physical assist with either. The pt continues to require assist to don CAM boot. The pt was able to demo improvements in ambulation endurance and good safety with stair navigation. The pt will continue to benefit from skilled therapy to progress endurance and safety both in hospital and following d/c as the surgeon feels is appropriate.    Follow Up Recommendations  Follow surgeon's recommendation for DC plan and follow-up therapies     Equipment Recommendations  None recommended by PT    Recommendations for Other Services       Precautions / Restrictions Precautions Precautions: None Precaution Comments: pt has CAM boot Restrictions Weight Bearing Restrictions: Yes RLE Weight Bearing: Weight bearing as tolerated    Mobility  Bed Mobility Overal bed mobility: Needs Assistance Bed Mobility: Supine to Sit     Supine to sit: Supervision        Transfers Overall transfer level: Needs assistance Equipment used: Rolling walker (2 wheeled) Transfers: Sit to/from Stand Sit to Stand: Supervision         General transfer comment: good use of  hands  Ambulation/Gait Ambulation/Gait assistance: Min guard Gait Distance (Feet): 150 Feet Assistive device: Rolling walker (2 wheeled) Gait Pattern/deviations: Decreased stance time - right;Antalgic Gait velocity: 0.18 m/s Gait velocity interpretation: <1.31 ft/sec, indicative of household ambulator General Gait Details: Antalgic gait, but good sequencing and only required supervision with use of RW.   Stairs Stairs: Yes Stairs assistance: Supervision Stair Management: One rail Right Number of Stairs: 3(x2) General stair comments: pt with no LOB, good navigation with safe decision making on stairs. good demonstration of technique   Wheelchair Mobility    Modified Rankin (Stroke Patients Only)       Balance Overall balance assessment: Needs assistance Sitting-balance support: Feet supported Sitting balance-Leahy Scale: Good     Standing balance support: During functional activity;Bilateral upper extremity supported;Single extremity supported Standing balance-Leahy Scale: Fair Standing balance comment: Pt with good stability on stairs with use of one rail, one UE supported. BUE supported for ambulation                            Cognition Arousal/Alertness: Awake/alert Behavior During Therapy: WFL for tasks assessed/performed Overall Cognitive Status: Within Functional Limits for tasks assessed                                        Exercises      General Comments        Pertinent Vitals/Pain Pain Assessment:  Faces Pain Score: 3  Faces Pain Scale: Hurts a little bit Pain Location: R ankle and knee Pain Descriptors / Indicators: Sore Pain Intervention(s): Limited activity within patient's tolerance;Monitored during session;Repositioned    Home Living                      Prior Function            PT Goals (current goals can now be found in the care plan section) Acute Rehab PT Goals Patient Stated Goal: return  home PT Goal Formulation: With patient Time For Goal Achievement: 10/31/19 Potential to Achieve Goals: Good Progress towards PT goals: Progressing toward goals    Frequency    Min 5X/week      PT Plan Current plan remains appropriate    Co-evaluation              AM-PAC PT "6 Clicks" Mobility   Outcome Measure  Help needed turning from your back to your side while in a flat bed without using bedrails?: A Little Help needed moving from lying on your back to sitting on the side of a flat bed without using bedrails?: A Little Help needed moving to and from a bed to a chair (including a wheelchair)?: A Little Help needed standing up from a chair using your arms (e.g., wheelchair or bedside chair)?: A Little Help needed to walk in hospital room?: A Little Help needed climbing 3-5 steps with a railing? : A Little 6 Click Score: 18    End of Session Equipment Utilized During Treatment: Gait belt Activity Tolerance: Patient tolerated treatment well Patient left: with call bell/phone within reach;in bed Nurse Communication: Mobility status PT Visit Diagnosis: Difficulty in walking, not elsewhere classified (R26.2);Pain Pain - Right/Left: Right Pain - part of body: Leg     Time: 2993-7169 PT Time Calculation (min) (ACUTE ONLY): 19 min  Charges:  $Gait Training: 8-22 mins                     Rolm Baptise, PT, DPT   Acute Rehabilitation Department [phone]   Gaetana Michaelis 10/18/2019, 2:24 PM

## 2019-10-18 NOTE — Plan of Care (Signed)
  Problem: Pain Management: Goal: Pain level will decrease with appropriate interventions Outcome: Progressing   Problem: Skin Integrity: Goal: Will show signs of wound healing Outcome: Progressing   Problem: Skin Integrity: Goal: Risk for impaired skin integrity will decrease Outcome: Progressing

## 2019-10-18 NOTE — Progress Notes (Signed)
Occupational Therapy Treatment Patient Details Name: Duane Collins MRN: [id number] DOB: [birth year] Today's Date: 10/18/2019    History of present illness Duane Collins is an 58 y.o. male who sustained an open fracture to his right tibia September 2019.  He was referred to the orthopedic trauma service to address nonunion.  He was then taken back to the operating room on 03/28/2019 for exchange nailing.  He has failed to show complete consolidation of his fracture and has continued pain over his distal locking bolts. Pt is s/p tibia IM nailing and removal of hardware (10/16/19).   OT comments  Pt making good progress with functional goals, improved LB ADLs to min guard A and mobility using RW to sup. Pt to d/c home this afternoon.   Follow Up Recommendations  Follow surgeon's recommendation for DC plan and follow-up therapies;Supervision - Intermittent    Equipment Recommendations   none   Recommendations for Other Services      Precautions / Restrictions Precautions Precautions: None Precaution Comments: pt has CAM boot Restrictions Weight Bearing Restrictions: Yes RLE Weight Bearing: Weight bearing as tolerated       Mobility Bed Mobility Overal bed mobility: Needs Assistance Bed Mobility: Supine to Sit     Supine to sit: Supervision        Transfers Overall transfer level: Needs assistance Equipment used: Rolling walker (2 wheeled) Transfers: Sit to/from Stand Sit to Stand: Supervision              Balance Overall balance assessment: Needs assistance Sitting-balance support: Feet supported Sitting balance-Leahy Scale: Good     Standing balance support: During functional activity;Bilateral upper extremity supported;Single extremity supported Standing balance-Leahy Scale: Fair                             ADL either performed or assessed with clinical judgement   ADL Overall ADL's : Needs assistance/impaired     Grooming: Wash/dry  hands;Wash/dry face;Set up;Supervision/safety;Standing       Lower Body Bathing: Sitting/lateral leans;Sit to/from stand;Min guard       Lower Body Dressing: Min guard;Sit to/from stand   Toilet Transfer: Supervision/safety;Ambulation;RW   Toileting- Clothing Manipulation and Hygiene: Supervision/safety;Sit to/from stand   Tub/ Shower Transfer: Supervision/safety;Ambulation;Rolling walker;3 in 1   Functional mobility during ADLs: Supervision/safety       Vision Patient Visual Report: No change from baseline     Perception     Praxis      Cognition Arousal/Alertness: Awake/alert Behavior During Therapy: WFL for tasks assessed/performed Overall Cognitive Status: Within Functional Limits for tasks assessed                                          Exercises     Shoulder Instructions       General Comments      Pertinent Vitals/ Pain       Pain Assessment: 0-10 Pain Score: 3  Pain Location: R ankle and knee Pain Descriptors / Indicators: Sore Pain Intervention(s): Monitored during session;Repositioned  Home Living                                          Prior Functioning/Environment  Frequency  Min 2X/week        Progress Toward Goals  OT Goals(current goals can now be found in the care plan section)  Progress towards OT goals: Progressing toward goals     Plan Discharge plan remains appropriate    Co-evaluation                 AM-PAC OT "6 Clicks" Daily Activity     Outcome Measure   Help from another person eating meals?: None Help from another person taking care of personal grooming?: None Help from another person toileting, which includes using toliet, bedpan, or urinal?: None Help from another person bathing (including washing, rinsing, drying)?: A Little Help from another person to put on and taking off regular upper body clothing?: None Help from another person to put on and  taking off regular lower body clothing?: A Little 6 Click Score: 22    End of Session Equipment Utilized During Treatment: Gait belt;Rolling walker;Other (comment)(3 in 1)  OT Visit Diagnosis: Other abnormalities of gait and mobility (R26.89);Muscle weakness (generalized) (M62.81);Pain Pain - Right/Left: Right Pain - part of body: Leg   Activity Tolerance Patient tolerated treatment well   Patient Left with call bell/phone within reach;Other (comment)(sitting EOB)   Nurse Communication          Time: 7902-4097 OT Time Calculation (min): 21 min  Charges: OT General Charges $OT Visit: 1 Visit OT Treatments $Self Care/Home Management : 8-22 mins     Galen Manila 10/18/2019, 12:32 PM

## 2019-10-18 NOTE — Progress Notes (Signed)
Orthopaedic Trauma Service Progress Note  Patient ID: Duane Collins MRN: [id number] DOB/AGE: November 26, 1960 58 y.o.  Subjective:  Doing well, no new issues, ready to go home Pain is moderate Mobilizing well  States that he checks his sugars twice a day and he will be compliant with his regimen.  Was supposed to follow-up with his PCP in [place] this week  His A1c indicates that his control is suboptimal  ROS As above Objective:   VITALS:   Vitals:   10/17/19 1500 10/17/19 1946 10/18/19 0348 10/18/19 0750  BP: (!) 152/76 (!) 142/72 (!) 142/72 138/73  Pulse: 62 68 63 63  Resp: 17 19 18 18   Temp: 98.5 F (36.9 C) 98.4 F (36.9 C) 98.3 F (36.8 C) 98.4 F (36.9 C)  TempSrc:  Oral Oral Oral  SpO2: 94% 99% 96% 97%  Weight:      Height:        Estimated body mass index is 36.79 kg/m as calculated from the following:   Height as of this encounter: 5\' 9"  (1.753 m).   Weight as of this encounter: 113 kg.   Intake/Output      12/15 0701 - 12/16 0700 12/16 0701 - 12/17 0700   P.O. 960    I.V. (mL/kg) 54.4 (0.5)    IV Piggyback     Total Intake(mL/kg) 1014.4 (9)    Urine (mL/kg/hr) 2325 (0.9)    Emesis/NG output 0    Blood     Total Output 2325    Net -1310.6         Emesis Occurrence 1 x      LABS  Results for orders placed or performed during the hospital encounter of 10/16/19 (from the past 24 hour(s))  Glucose, capillary     Status: Abnormal   Collection Time: 10/17/19 11:58 AM  Result Value Ref Range   Glucose-Capillary 147 (H) 70 - 99 mg/dL  Glucose, capillary     Status: Abnormal   Collection Time: 10/17/19  4:28 PM  Result Value Ref Range   Glucose-Capillary 172 (H) 70 - 99 mg/dL  Glucose, capillary     Status: Abnormal   Collection Time: 10/17/19  8:18 PM  Result Value Ref Range   Glucose-Capillary 195 (H) 70 - 99 mg/dL  Glucose, capillary     Status: Abnormal   Collection  Time: 10/18/19  6:41 AM  Result Value Ref Range   Glucose-Capillary 111 (H) 70 - 99 mg/dL     PHYSICAL EXAM:   Gen: Resting comfortably in bed, no acute distress, appears well Lungs: Breathing is unlabored, clear Cardiac: Regular rate and rhythm, S1 and S2 Abd: Soft, nontender, nondistended Ext:       Right lower extremity  Dressing removed, incisions look fantastic and are dry, no active drainage  Extremity is warm  Swelling is stable  No deep calf tenderness  No pain out of proportion with passive stretching  + DP pulse  DPN, SPN, TN sensory functions intact  EHL, FHL, lesser toe motor function intact  Ankle flexion, extension, inversion and eversion are intact  Assessment/Plan: 2 Days Post-Op   Principal Problem:   Tibia and fibula open fracture, right, type I or II, with nonunion, subsequent encounter Active Problems:   Hypertension   Anxiety   Vitamin D deficiency  Hypogonadism in male   Painful orthopaedic hardware [facility])   Fracture of tibia and fibula, shaft, right, open type I or II, with nonunion, subsequent encounter   Anti-infectives (From admission, onward)   Start     Dose/Rate Route Frequency Ordered Stop   10/16/19 1500  ceFAZolin (ANCEF) IVPB 1 g/50 mL premix     1 g 100 mL/hr over 30 Minutes Intravenous Every 6 hours 10/16/19 1307 10/17/19 0251   10/16/19 0600  ceFAZolin (ANCEF) IVPB 2g/100 mL premix     2 g 200 mL/hr over 30 Minutes Intravenous On call to O.R. 10/16/19 3785 10/16/19 0843    .  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   -R tibia and fibula persistent nonunion s/p repeat exchange nailing             WBAT R leg with assistance              Ok to WB in CAM boot for support             Unrestricted ROM R knee and ankle             Ice and elevate           DC home today                - Pain management:            Discharge home with Norco 7.5/325, Robaxin and Cymbalta for nerve  pain             Ice               - ABL anemia/Hemodynamics             Stable   - Medical issues              DM                         SSI                          Restart home meds at dc                                                  His A1c back in 03/2019 was 1% lower than this admission. ? Compliance with DM management                          This has likely contributed to continued nonunion    Follow-up with PCP                          Vitamin d deficiency                          Continue supplementation                          Levels look better than last admission    - DVT/PE prophylaxis:             Mobilize  scds   - ID:              periop abx completed   - Metabolic Bone Disease:             Continue vitamin d    - Activity:             WBAT with assistance    - FEN/GI prophylaxis/Foley/Lines:             Carb mod diet    - Dispo:            DC home today  Follow-up with orthopedics in 10 to 14 days  Follow-up with PCP in 10 to 14 days as well regarding diabetes management  Mearl LatinKeith W. Kaicee Scarpino, PA-C [phone] (C) 10/18/2019, 9:32 AM  [facility] [address] [place] [address] [phone] Val [phone] (F)   After 6pm on weekdays please call office number to get in touch with on call provider or refer to Amion and look to see who is on call for the Sports Medicine Call Group which is listed under orthopaedics   On Weekends please call office number to get in touch with on call provider or refer to Amion and look to see who is on call for the Sports Medicine Call Group which is listed under orthopaedics

## 2019-10-18 NOTE — Progress Notes (Signed)
Provided discharge education/instructions, all questions and concerns addressed, Pt not in distress, discharged home with belongings accompanied by wife. 

## 2019-10-21 LAB — AEROBIC/ANAEROBIC CULTURE W GRAM STAIN (SURGICAL/DEEP WOUND): Culture: NO GROWTH

## 2019-12-04 ENCOUNTER — Encounter: Payer: Self-pay | Admitting: Gastroenterology

## 2020-12-16 ENCOUNTER — Other Ambulatory Visit (HOSPITAL_COMMUNITY)
Admission: RE | Admit: 2020-12-16 | Discharge: 2020-12-16 | Disposition: A | Discharge status: Home / Self Care | Payer: BLUE CROSS/BLUE SHIELD | Source: Ambulatory Visit | Attending: Orthopedic Surgery | Admitting: Orthopedic Surgery

## 2020-12-16 ENCOUNTER — Encounter (HOSPITAL_COMMUNITY): Payer: Self-pay | Admitting: Orthopedic Surgery

## 2020-12-16 DIAGNOSIS — Z01812 Encounter for preprocedural laboratory examination: Secondary | ICD-10-CM | POA: Insufficient documentation

## 2020-12-16 DIAGNOSIS — Z20822 Contact with and (suspected) exposure to covid-19: Secondary | ICD-10-CM | POA: Insufficient documentation

## 2020-12-16 LAB — SARS CORONAVIRUS 2 (TAT 6-24 HRS): SARS Coronavirus 2: NEGATIVE

## 2020-12-16 NOTE — Anesthesia Preprocedure Evaluation (Addendum)
Anesthesia Evaluation  Patient identified by MRN, date of birth, ID band Patient awake    Reviewed: Allergy & Precautions, NPO status , Patient's Chart, lab work & pertinent test results  Airway Mallampati: III  TM Distance: >3 FB Neck ROM: Full    Dental  (+) Dental Advisory Given   Pulmonary asthma , former smoker,    breath sounds clear to auscultation       Cardiovascular hypertension, Pt. on medications  Rhythm:Regular Rate:Normal     Neuro/Psych negative neurological ROS     GI/Hepatic negative GI ROS, Neg liver ROS,   Endo/Other  diabetes, Type 2  Renal/GU negative Renal ROS     Musculoskeletal  (+) Arthritis ,   Abdominal   Peds  Hematology   Anesthesia Other Findings   Reproductive/Obstetrics                           Anesthesia Physical Anesthesia Plan  ASA: III  Anesthesia Plan: General   Post-op Pain Management:    Induction: Intravenous  PONV Risk Score and Plan: 2 and Dexamethasone, Ondansetron and Treatment may vary due to age or medical condition  Airway Management Planned: Oral ETT  Additional Equipment: None  Intra-op Plan:   Post-operative Plan: Extubation in OR  Informed Consent: I have reviewed the patients History and Physical, chart, labs and discussed the procedure including the risks, benefits and alternatives for the proposed anesthesia with the patient or authorized representative who has indicated his/her understanding and acceptance.     Dental advisory given  Plan Discussed with: CRNA  Anesthesia Plan Comments:        Anesthesia Quick Evaluation

## 2020-12-16 NOTE — Progress Notes (Signed)
EKG:  DOS CXR:  N/A ECHO:  Denies Stress Test:  Denies Cardiac Cath:  Denies  Fasting Blood Sugar-  N/A Checks Blood Sugar__Na_ times a day  OSA: Yes CPAP:  No  ASA/Blood Thinners:  No  Covid test 12/16/20  Anesthesia Review:  No  Patient denies shortness of breath, fever, cough, and chest pain at PAT appointment.  Patient verbalized understanding of instructions provided today at the PAT appointment.  Patient asked to review instructions at home and day of surgery.

## 2020-12-17 ENCOUNTER — Ambulatory Visit (HOSPITAL_COMMUNITY): Payer: No Typology Code available for payment source | Admitting: Certified Registered Nurse Anesthetist

## 2020-12-17 ENCOUNTER — Encounter (HOSPITAL_COMMUNITY): Payer: Self-pay | Admitting: Orthopedic Surgery

## 2020-12-17 ENCOUNTER — Ambulatory Visit (HOSPITAL_COMMUNITY): Payer: BLUE CROSS/BLUE SHIELD

## 2020-12-17 ENCOUNTER — Encounter (HOSPITAL_COMMUNITY)
Admission: RE | Disposition: A | Discharge status: Home / Self Care | Payer: Self-pay | Source: Home / Self Care | Attending: Orthopedic Surgery

## 2020-12-17 ENCOUNTER — Ambulatory Visit (HOSPITAL_COMMUNITY)
Admission: RE | Admit: 2020-12-17 | Discharge: 2020-12-17 | Disposition: A | Discharge status: Home / Self Care | Payer: No Typology Code available for payment source | Attending: Orthopedic Surgery | Admitting: Orthopedic Surgery

## 2020-12-17 DIAGNOSIS — Z87891 Personal history of nicotine dependence: Secondary | ICD-10-CM | POA: Diagnosis not present

## 2020-12-17 DIAGNOSIS — E119 Type 2 diabetes mellitus without complications: Secondary | ICD-10-CM | POA: Insufficient documentation

## 2020-12-17 DIAGNOSIS — X58XXXA Exposure to other specified factors, initial encounter: Secondary | ICD-10-CM | POA: Diagnosis not present

## 2020-12-17 DIAGNOSIS — Z7984 Long term (current) use of oral hypoglycemic drugs: Secondary | ICD-10-CM | POA: Insufficient documentation

## 2020-12-17 DIAGNOSIS — Z79899 Other long term (current) drug therapy: Secondary | ICD-10-CM | POA: Diagnosis not present

## 2020-12-17 DIAGNOSIS — M79661 Pain in right lower leg: Secondary | ICD-10-CM | POA: Insufficient documentation

## 2020-12-17 DIAGNOSIS — Z419 Encounter for procedure for purposes other than remedying health state, unspecified: Secondary | ICD-10-CM | POA: Insufficient documentation

## 2020-12-17 DIAGNOSIS — T8484XA Pain due to internal orthopedic prosthetic devices, implants and grafts, initial encounter: Secondary | ICD-10-CM

## 2020-12-17 DIAGNOSIS — Z885 Allergy status to narcotic agent status: Secondary | ICD-10-CM | POA: Diagnosis not present

## 2020-12-17 DIAGNOSIS — Z01812 Encounter for preprocedural laboratory examination: Secondary | ICD-10-CM | POA: Diagnosis not present

## 2020-12-17 HISTORY — PX: HARDWARE REMOVAL: SHX979

## 2020-12-17 LAB — BASIC METABOLIC PANEL
Anion gap: 8 (ref 5–15)
BUN: 11 mg/dL (ref 6–20)
CO2: 27 mmol/L (ref 22–32)
Calcium: 9.2 mg/dL (ref 8.9–10.3)
Chloride: 103 mmol/L (ref 98–111)
Creatinine, Ser: 0.84 mg/dL (ref 0.61–1.24)
GFR, Estimated: >60 mL/min (ref >60)
Glucose, Bld: 117 mg/dL — ABNORMAL HIGH (ref 70–99)
Potassium: 3.9 mmol/L (ref 3.5–5.1)
Sodium: 138 mmol/L (ref 135–145)

## 2020-12-17 LAB — GLUCOSE, CAPILLARY
Glucose-Capillary: 114 mg/dL — ABNORMAL HIGH (ref 70–99)
Glucose-Capillary: 138 mg/dL — ABNORMAL HIGH (ref 70–99)

## 2020-12-17 LAB — CBC
HCT: 39.8 % (ref 39.0–52.0)
Hemoglobin: 12.7 g/dL — ABNORMAL LOW (ref 13.0–17.0)
MCH: 31 pg (ref 26.0–34.0)
MCHC: 31.9 g/dL (ref 30.0–36.0)
MCV: 97.1 fL (ref 80.0–100.0)
Platelets: 272 10*3/uL (ref 150–400)
RBC: 4.1 MIL/uL — ABNORMAL LOW (ref 4.22–5.81)
RDW: 14.1 % (ref 11.5–15.5)
WBC: 5.6 10*3/uL (ref 4.0–10.5)
nRBC: 0 % (ref 0.0–0.2)

## 2020-12-17 SURGERY — REMOVAL, HARDWARE
Anesthesia: General | Laterality: Right

## 2020-12-17 MED ORDER — METHOCARBAMOL 500 MG PO TABS: 500.0000 mg | ORAL_TABLET | Freq: Three times a day (TID) | ORAL | 0 refills | Status: DC | PRN

## 2020-12-17 MED ORDER — SUGAMMADEX SODIUM 200 MG/2ML IV SOLN
INTRAVENOUS | Status: DC | PRN
  Administered 2020-12-17 (×2): 100 mg via INTRAVENOUS
  Administered 2020-12-17: 200 mg via INTRAVENOUS

## 2020-12-17 MED ORDER — FENTANYL CITRATE (PF) 250 MCG/5ML IJ SOLN
INTRAMUSCULAR | Status: AC
  Filled 2020-12-17: qty 5

## 2020-12-17 MED ORDER — CHLORHEXIDINE GLUCONATE 0.12 % MT SOLN
15.0000 mL | Freq: Once | OROMUCOSAL | Status: AC
  Administered 2020-12-17: 15 mL via OROMUCOSAL
  Filled 2020-12-17: qty 15

## 2020-12-17 MED ORDER — FENTANYL CITRATE (PF) 100 MCG/2ML IJ SOLN
INTRAMUSCULAR | Status: AC
  Filled 2020-12-17: qty 2

## 2020-12-17 MED ORDER — HYDROCODONE-ACETAMINOPHEN 5-325 MG PO TABS: 1.0000 | ORAL_TABLET | Freq: Three times a day (TID) | ORAL | 0 refills | Status: DC | PRN

## 2020-12-17 MED ORDER — ONDANSETRON HCL 4 MG/2ML IJ SOLN
INTRAMUSCULAR | Status: DC | PRN
  Administered 2020-12-17: 4 mg via INTRAVENOUS

## 2020-12-17 MED ORDER — VANCOMYCIN HCL 1000 MG IV SOLR
INTRAVENOUS | Status: AC
  Filled 2020-12-17: qty 1000

## 2020-12-17 MED ORDER — EPINEPHRINE PF 1 MG/ML IJ SOLN
INTRAMUSCULAR | Status: AC
  Filled 2020-12-17: qty 1

## 2020-12-17 MED ORDER — MIDAZOLAM HCL 2 MG/2ML IJ SOLN
INTRAMUSCULAR | Status: AC
  Filled 2020-12-17: qty 2

## 2020-12-17 MED ORDER — ROCURONIUM BROMIDE 10 MG/ML (PF) SYRINGE
PREFILLED_SYRINGE | INTRAVENOUS | Status: AC
  Filled 2020-12-17: qty 10

## 2020-12-17 MED ORDER — BUPIVACAINE HCL (PF) 0.25 % IJ SOLN
INTRAMUSCULAR | Status: AC
  Filled 2020-12-17: qty 30

## 2020-12-17 MED ORDER — PROPOFOL 10 MG/ML IV BOLUS
INTRAVENOUS | Status: AC
  Filled 2020-12-17: qty 20

## 2020-12-17 MED ORDER — LACTATED RINGERS IV SOLN: INTRAVENOUS | Status: DC

## 2020-12-17 MED ORDER — ONDANSETRON HCL 4 MG/2ML IJ SOLN
INTRAMUSCULAR | Status: AC
  Filled 2020-12-17: qty 2

## 2020-12-17 MED ORDER — MIDAZOLAM HCL 2 MG/2ML IJ SOLN
INTRAMUSCULAR | Status: DC | PRN
  Administered 2020-12-17: 1 mg via INTRAVENOUS

## 2020-12-17 MED ORDER — 0.9 % SODIUM CHLORIDE (POUR BTL) OPTIME
TOPICAL | Status: DC | PRN
  Administered 2020-12-17: 1000 mL

## 2020-12-17 MED ORDER — PROPOFOL 10 MG/ML IV BOLUS
INTRAVENOUS | Status: DC | PRN
  Administered 2020-12-17: 200 mg via INTRAVENOUS

## 2020-12-17 MED ORDER — BUPIVACAINE HCL (PF) 0.5 % IJ SOLN
INTRAMUSCULAR | Status: AC
  Filled 2020-12-17: qty 30

## 2020-12-17 MED ORDER — CELECOXIB 200 MG PO CAPS
200.0000 mg | ORAL_CAPSULE | Freq: Once | ORAL | Status: AC
  Administered 2020-12-17: 200 mg via ORAL
  Filled 2020-12-17: qty 1

## 2020-12-17 MED ORDER — HYDROCODONE-ACETAMINOPHEN 5-325 MG PO TABS
ORAL_TABLET | ORAL | Status: AC
  Filled 2020-12-17: qty 1

## 2020-12-17 MED ORDER — LIDOCAINE 2% (20 MG/ML) 5 ML SYRINGE
INTRAMUSCULAR | Status: DC | PRN
  Administered 2020-12-17: 60 mg via INTRAVENOUS

## 2020-12-17 MED ORDER — HYDROCODONE-ACETAMINOPHEN 5-325 MG PO TABS
1.0000 | ORAL_TABLET | Freq: Once | ORAL | Status: AC
  Administered 2020-12-17: 1 via ORAL

## 2020-12-17 MED ORDER — ROCURONIUM BROMIDE 10 MG/ML (PF) SYRINGE
PREFILLED_SYRINGE | INTRAVENOUS | Status: DC | PRN
  Administered 2020-12-17: 60 mg via INTRAVENOUS
  Administered 2020-12-17: 40 mg via INTRAVENOUS

## 2020-12-17 MED ORDER — PHENYLEPHRINE 40 MCG/ML (10ML) SYRINGE FOR IV PUSH (FOR BLOOD PRESSURE SUPPORT)
PREFILLED_SYRINGE | INTRAVENOUS | Status: AC
  Filled 2020-12-17: qty 10

## 2020-12-17 MED ORDER — KETOROLAC TROMETHAMINE 10 MG PO TABS: 10.0000 mg | ORAL_TABLET | Freq: Four times a day (QID) | ORAL | 0 refills | Status: DC | PRN

## 2020-12-17 MED ORDER — DEXAMETHASONE SODIUM PHOSPHATE 10 MG/ML IJ SOLN
INTRAMUSCULAR | Status: DC | PRN
  Administered 2020-12-17: 5 mg via INTRAVENOUS

## 2020-12-17 MED ORDER — ORAL CARE MOUTH RINSE: 15.0000 mL | Freq: Once | OROMUCOSAL | Status: AC

## 2020-12-17 MED ORDER — FENTANYL CITRATE (PF) 100 MCG/2ML IJ SOLN
25.0000 ug | INTRAMUSCULAR | Status: DC | PRN
Start: 2020-12-17 — End: 2020-12-17
  Administered 2020-12-17: 25 ug via INTRAVENOUS

## 2020-12-17 MED ORDER — CEFAZOLIN SODIUM-DEXTROSE 2-4 GM/100ML-% IV SOLN
2.0000 g | INTRAVENOUS | Status: AC
  Administered 2020-12-17: 2 g via INTRAVENOUS
  Filled 2020-12-17: qty 100

## 2020-12-17 MED ORDER — EPHEDRINE SULFATE-NACL 50-0.9 MG/10ML-% IV SOSY
PREFILLED_SYRINGE | INTRAVENOUS | Status: DC | PRN
  Administered 2020-12-17: 5 mg via INTRAVENOUS
  Administered 2020-12-17 (×3): 10 mg via INTRAVENOUS

## 2020-12-17 MED ORDER — DEXAMETHASONE SODIUM PHOSPHATE 10 MG/ML IJ SOLN
INTRAMUSCULAR | Status: AC
  Filled 2020-12-17: qty 1

## 2020-12-17 MED ORDER — EPINEPHRINE PF 1 MG/ML IJ SOLN
INTRAMUSCULAR | Status: DC | PRN
  Administered 2020-12-17: 1 mg

## 2020-12-17 MED ORDER — LIDOCAINE 2% (20 MG/ML) 5 ML SYRINGE
INTRAMUSCULAR | Status: AC
  Filled 2020-12-17: qty 5

## 2020-12-17 MED ORDER — AMISULPRIDE (ANTIEMETIC) 5 MG/2ML IV SOLN: 10.0000 mg | Freq: Once | INTRAVENOUS | Status: DC | PRN

## 2020-12-17 MED ORDER — FENTANYL CITRATE (PF) 250 MCG/5ML IJ SOLN
INTRAMUSCULAR | Status: DC | PRN
  Administered 2020-12-17 (×2): 50 ug via INTRAVENOUS
  Administered 2020-12-17: 100 ug via INTRAVENOUS

## 2020-12-17 MED ORDER — ACETAMINOPHEN 500 MG PO TABS
1000.0000 mg | ORAL_TABLET | Freq: Once | ORAL | Status: AC
  Administered 2020-12-17: 1000 mg via ORAL
  Filled 2020-12-17: qty 2

## 2020-12-17 MED ORDER — PHENYLEPHRINE 40 MCG/ML (10ML) SYRINGE FOR IV PUSH (FOR BLOOD PRESSURE SUPPORT)
PREFILLED_SYRINGE | INTRAVENOUS | Status: DC | PRN
  Administered 2020-12-17: 80 ug via INTRAVENOUS

## 2020-12-17 MED ORDER — BUPIVACAINE HCL 0.25 % IJ SOLN
INTRAMUSCULAR | Status: DC | PRN
  Administered 2020-12-17: 30 mL

## 2020-12-17 MED ORDER — ACETAMINOPHEN 500 MG PO TABS: 500.0000 mg | ORAL_TABLET | Freq: Three times a day (TID) | ORAL | 0 refills | Status: AC

## 2020-12-17 SURGICAL SUPPLY — 62 items
BANDAGE ESMARK 6X9 LF (GAUZE/BANDAGES/DRESSINGS) ×1 IMPLANT
BNDG COHESIVE 6X5 TAN STRL LF (GAUZE/BANDAGES/DRESSINGS) ×2 IMPLANT
BNDG ELASTIC 4X5.8 VLCR STR LF (GAUZE/BANDAGES/DRESSINGS) ×2 IMPLANT
BNDG ELASTIC 6X5.8 VLCR STR LF (GAUZE/BANDAGES/DRESSINGS) ×2 IMPLANT
BNDG ESMARK 6X9 LF (GAUZE/BANDAGES/DRESSINGS) ×2
BNDG GAUZE ELAST 4 BULKY (GAUZE/BANDAGES/DRESSINGS) ×4 IMPLANT
BRUSH SCRUB EZ PLAIN DRY (MISCELLANEOUS) ×4 IMPLANT
COVER SURGICAL LIGHT HANDLE (MISCELLANEOUS) ×4 IMPLANT
COVER WAND RF STERILE (DRAPES) ×2 IMPLANT
CUFF TOURN SGL QUICK 18X4 (TOURNIQUET CUFF) IMPLANT
CUFF TOURN SGL QUICK 24 (TOURNIQUET CUFF)
CUFF TOURN SGL QUICK 34 (TOURNIQUET CUFF)
CUFF TRNQT CYL 24X4X16.5-23 (TOURNIQUET CUFF) IMPLANT
CUFF TRNQT CYL 34X4.125X (TOURNIQUET CUFF) IMPLANT
DRAPE C-ARM 42X72 X-RAY (DRAPES) IMPLANT
DRAPE C-ARMOR (DRAPES) ×2 IMPLANT
DRAPE U-SHAPE 47X51 STRL (DRAPES) ×2 IMPLANT
DRSG ADAPTIC 3X8 NADH LF (GAUZE/BANDAGES/DRESSINGS) ×2 IMPLANT
DRSG EMULSION OIL 3X3 NADH (GAUZE/BANDAGES/DRESSINGS) ×2 IMPLANT
ELECT REM PT RETURN 9FT ADLT (ELECTROSURGICAL) ×2
ELECTRODE REM PT RTRN 9FT ADLT (ELECTROSURGICAL) ×1 IMPLANT
GAUZE SPONGE 4X4 12PLY STRL (GAUZE/BANDAGES/DRESSINGS) ×2 IMPLANT
GLOVE BIO SURGEON STRL SZ7.5 (GLOVE) ×2 IMPLANT
GLOVE BIO SURGEON STRL SZ8 (GLOVE) ×2 IMPLANT
GLOVE BIOGEL PI IND STRL 7.5 (GLOVE) ×1 IMPLANT
GLOVE BIOGEL PI INDICATOR 7.5 (GLOVE) ×1
GLOVE SRG 8 PF TXTR STRL LF DI (GLOVE) ×1 IMPLANT
GLOVE SURG UNDER POLY LF SZ8 (GLOVE) ×1
GOWN STRL REUS W/ TWL LRG LVL3 (GOWN DISPOSABLE) ×2 IMPLANT
GOWN STRL REUS W/ TWL XL LVL3 (GOWN DISPOSABLE) ×1 IMPLANT
GOWN STRL REUS W/TWL LRG LVL3 (GOWN DISPOSABLE) ×2
GOWN STRL REUS W/TWL XL LVL3 (GOWN DISPOSABLE) ×1
KIT BASIN OR (CUSTOM PROCEDURE TRAY) ×2 IMPLANT
KIT TURNOVER KIT B (KITS) ×2 IMPLANT
MANIFOLD NEPTUNE II (INSTRUMENTS) ×2 IMPLANT
NEEDLE 22X1 1/2 (OR ONLY) (NEEDLE) ×2 IMPLANT
NS IRRIG 1000ML POUR BTL (IV SOLUTION) ×2 IMPLANT
PACK ORTHO EXTREMITY (CUSTOM PROCEDURE TRAY) ×2 IMPLANT
PAD ABD 7.5X8 STRL (GAUZE/BANDAGES/DRESSINGS) ×2 IMPLANT
PAD ARMBOARD 7.5X6 YLW CONV (MISCELLANEOUS) ×4 IMPLANT
PAD CAST 4YDX4 CTTN HI CHSV (CAST SUPPLIES) ×1 IMPLANT
PADDING CAST COTTON 4X4 STRL (CAST SUPPLIES) ×1
PADDING CAST COTTON 6X4 STRL (CAST SUPPLIES) ×6 IMPLANT
SPONGE LAP 18X18 RF (DISPOSABLE) ×2 IMPLANT
STAPLER VISISTAT 35W (STAPLE) IMPLANT
STOCKINETTE IMPERVIOUS LG (DRAPES) ×2 IMPLANT
STRIP CLOSURE SKIN 1/2X4 (GAUZE/BANDAGES/DRESSINGS) IMPLANT
SUCTION FRAZIER HANDLE 10FR (MISCELLANEOUS)
SUCTION TUBE FRAZIER 10FR DISP (MISCELLANEOUS) IMPLANT
SUT ETHILON 3 0 PS 1 (SUTURE) IMPLANT
SUT PDS AB 2-0 CT1 27 (SUTURE) IMPLANT
SUT VIC AB 0 CT1 27 (SUTURE)
SUT VIC AB 0 CT1 27XBRD ANBCTR (SUTURE) IMPLANT
SUT VIC AB 2-0 CT1 27 (SUTURE)
SUT VIC AB 2-0 CT1 TAPERPNT 27 (SUTURE) IMPLANT
SYR CONTROL 10ML LL (SYRINGE) ×2 IMPLANT
TOWEL GREEN STERILE (TOWEL DISPOSABLE) ×4 IMPLANT
TOWEL GREEN STERILE FF (TOWEL DISPOSABLE) ×4 IMPLANT
TUBE CONNECTING 12X1/4 (SUCTIONS) ×2 IMPLANT
UNDERPAD 30X36 HEAVY ABSORB (UNDERPADS AND DIAPERS) ×2 IMPLANT
WATER STERILE IRR 1000ML POUR (IV SOLUTION) ×4 IMPLANT
YANKAUER SUCT BULB TIP NO VENT (SUCTIONS) ×2 IMPLANT

## 2020-12-17 NOTE — Discharge Instructions (Addendum)
[facility] Discharge Instructions   General Discharge Instructions   WEIGHT BEARING STATUS: Weight-bear as tolerated right leg, okay to use crutches or a cane if needed for short period of time  RANGE OF MOTION/ACTIVITY: Unrestricted range of motion right knee and ankle.  Activity as tolerated.  Slowly increase activity level  Wound Care: Daily wound care starting on 12/19/2020.  Please see below  Discharge Wound Care Instructions  Do NOT apply any ointments, solutions or lotions to pin sites or surgical wounds.  These prevent needed drainage and even though solutions like hydrogen peroxide kill bacteria, they also damage cells lining the pin sites that help fight infection.  Applying lotions or ointments can keep the wounds moist and can cause them to breakdown and open up as well. This can increase the risk for infection. When in doubt call the office.  Surgical incisions should be dressed daily.  If any drainage is noted, use one layer of adaptic, then gauze, Kerlix, and an ace wrap.  Once the incision is completely dry and without drainage, it may be left open to air out.  Showering may begin 36-48 hours later.  Cleaning gently with soap and water.  Traumatic wounds should be dressed daily as well.    One layer of adaptic, gauze, Kerlix, then ace wrap.  The adaptic can be discontinued once the draining has ceased    If you have a wet to dry dressing: wet the gauze with saline the squeeze as much saline out so the gauze is moist (not soaking wet), place moistened gauze over wound, then place a dry gauze over the moist one, followed by Kerlix wrap, then ace wrap.   Diet: as you were eating previously.  Can use over the counter stool softeners and bowel preparations, such as Miralax, to help with bowel movements.  Narcotics can be constipating.  Be sure to drink plenty of fluids  PAIN MEDICATION USE AND EXPECTATIONS  You have likely been given narcotic medications  to help control your pain.  After a traumatic event that results in an fracture (broken bone) with or without surgery, it is ok to use narcotic pain medications to help control one's pain.  We understand that everyone responds to pain differently and each individual patient will be evaluated on a regular basis for the continued need for narcotic medications. Ideally, narcotic medication use should last no more than 6-8 weeks (coinciding with fracture healing).  For hardware removal narcotic medications should not be required after a week  As a patient it is your responsibility as well to monitor narcotic medication use and report the amount and frequency you use these medications when you come to your office visit.   We would also advise that if you are using narcotic medications, you should take a dose prior to therapy to maximize you participation.  IF YOU ARE ON NARCOTIC MEDICATIONS IT IS NOT PERMISSIBLE TO OPERATE A MOTOR VEHICLE (MOTORCYCLE/CAR/TRUCK/MOPED) OR HEAVY MACHINERY DO NOT MIX NARCOTICS WITH OTHER CNS (CENTRAL NERVOUS SYSTEM) DEPRESSANTS SUCH AS ALCOHOL   STOP SMOKING OR USING NICOTINE PRODUCTS!!!!  As discussed nicotine severely impairs your body's ability to heal surgical and traumatic wounds but also impairs bone healing.  Wounds and bone heal by forming microscopic blood vessels (angiogenesis) and nicotine is a vasoconstrictor (essentially, shrinks blood vessels).  Therefore, if vasoconstriction occurs to these microscopic blood vessels they essentially disappear and are unable to deliver necessary nutrients to the healing tissue.  This is one  modifiable factor that you can do to dramatically increase your chances of healing your injury.    (This means no smoking, no nicotine gum, patches, etc)   ICE AND ELEVATE INJURED/OPERATIVE EXTREMITY  Using ice and elevating the injured extremity above your heart can help with swelling and pain control.  Icing in a pulsatile fashion, such as 20  minutes on and 20 minutes off, can be followed.    Do not place ice directly on skin. Make sure there is a barrier between to skin and the ice pack.    Using frozen items such as frozen peas works well as the conform nicely to the are that needs to be iced.  USE AN ACE WRAP OR TED HOSE FOR SWELLING CONTROL  In addition to icing and elevation, Ace wraps or TED hose are used to help limit and resolve swelling.  It is recommended to use Ace wraps or TED hose until you are informed to stop.    When using Ace Wraps start the wrapping distally (farthest away from the body) and wrap proximally (closer to the body)   Example: If you had surgery on your leg or thing and you do not have a splint on, start the ace wrap at the toes and work your way up to the thigh        If you had surgery on your upper extremity and do not have a splint on, start the ace wrap at your fingers and work your way up to the upper arm  IF YOU ARE IN A SPLINT OR CAST DO NOT REMOVE IT FOR ANY REASON   If your splint gets wet for any reason please contact the office immediately. You may shower in your splint or cast as long as you keep it dry.  This can be done by wrapping in a cast cover or garbage back (or similar)  Do Not stick any thing down your splint or cast such as pencils, money, or hangers to try and scratch yourself with.  If you feel itchy take benadryl as prescribed on the bottle for itching  IF YOU ARE IN A CAM BOOT (BLACK BOOT)  You may remove boot periodically. Perform daily dressing changes as noted below.  Wash the liner of the boot regularly and wear a sock when wearing the boot. It is recommended that you sleep in the boot until told otherwise    Call office for the following:  Temperature greater than 101F  Persistent nausea and vomiting  Severe uncontrolled pain  Redness, tenderness, or signs of infection (pain, swelling, redness, odor or green/yellow discharge around the site)  Difficulty breathing,  headache or visual disturbances  Hives  Persistent dizziness or light-headedness  Extreme fatigue  Any other questions or concerns you may have after discharge  In an emergency, call 911 or go to an Emergency Department at a nearby hospital  HELPFUL INFORMATION  ? If you had a block, it will wear off between 8-24 hrs postop typically.  This is period when your pain may go from nearly zero to the pain you would have had postop without the block.  This is an abrupt transition but nothing dangerous is happening.  You may take an extra dose of narcotic when this happens.  ? You should wean off your narcotic medicines as soon as you are able.  Most patients will be off or using minimal narcotics before their first postop appointment.   ? We suggest you use the pain  medication the first night prior to going to bed, in order to ease any pain when the anesthesia wears off. You should avoid taking pain medications on an empty stomach as it will make you nauseous.  ? Do not drink alcoholic beverages or take illicit drugs when taking pain medications.  ? In most states it is against the law to drive while you are in a splint or sling.  And certainly against the law to drive while taking narcotics.  ? You may return to work/school in the next couple of days when you feel up to it.   ? Pain medication may make you constipated.  Below are a few solutions to try in this order: - Decrease the amount of pain medication if you arent having pain. - Drink lots of decaffeinated fluids. - Drink prune juice and/or each dried prunes  o If the first 3 dont work start with additional solutions - Take Colace - an over-the-counter stool softener - Take Senokot - an over-the-counter laxative - Take Miralax - a stronger over-the-counter laxative     CALL THE OFFICE WITH ANY QUESTIONS OR CONCERNS: [phone]   VISIT OUR WEBSITE FOR ADDITIONAL INFORMATION: [email or web address]

## 2020-12-17 NOTE — Anesthesia Procedure Notes (Signed)
Procedure Name: Intubation Date/Time: 12/17/2020 8:29 AM Performed by: Dorthea Cove, CRNA Pre-anesthesia Checklist: Patient identified, Emergency Drugs available, Suction available and Patient being monitored Patient Re-evaluated:Patient Re-evaluated prior to induction Oxygen Delivery Method: Circle system utilized Preoxygenation: Pre-oxygenation with 100% oxygen Induction Type: IV induction Ventilation: Mask ventilation without difficulty and Oral airway inserted - appropriate to patient size Laryngoscope Size: Mac and 4 Grade View: Grade II Tube type: Oral Tube size: 7.5 mm Number of attempts: 1 Airway Equipment and Method: Stylet and Oral airway Placement Confirmation: ETT inserted through vocal cords under direct vision,  positive ETCO2 and breath sounds checked- equal and bilateral Secured at: 22 cm Tube secured with: Tape Dental Injury: Teeth and Oropharynx as per pre-operative assessment

## 2020-12-17 NOTE — Op Note (Signed)
12/17/2020  10:58 AM  PATIENT:  Duane Collins  60 y.o. male  PRE-OPERATIVE DIAGNOSIS:  SYMPTOMATIC HARDWARE RIGHT TIBIA NAIL AND PLATE  POST-OPERATIVE DIAGNOSIS:  SYMPTOMATIC HARDWARE RIGHT TIBIA NAIL AND PLATE  PROCEDURE:  Procedure(s): 1. HARDWARE REMOVAL TIBIA (Right) intramedullary nail 2. HARDWARE REMOVAL TIBIA (Right) plate and screws  SURGEON:  Surgeon(s) and Role:    Myrene Galas, MD - Primary  PHYSICIAN ASSISTANT: Montez Morita, PA-C  ANESTHESIA:   general  I/O:  Total I/O In: 1100 [I.V.:1000; IV Piggyback:100] Out: 25 [Blood:25]  SPECIMEN:  No Specimen  TOURNIQUET:  * No tourniquets in log *  COMPLICATIONS: NONE  DICTATION: .Note written in EPIC  DISPOSITION: TO PACU  CONDITION: STABLE  DELAY START OF DVT PROPHYLAXIS BECAUSE OF BLEEDING RISK: NO   BRIEF SUMMARY OF INDICATION FOR PROCEDURE:  Duane Collins is a very pleasant 60 y.o. who sustained right segmental tibia fracture, complicated by nonunion and CRPS/ RSD.  The patient went on to unite and now presents for elective removal of the hardware because of continued tenderness about the implants, pain in the leg, and anticipated future TKA, that did not resolve with observation or conservative measures. The patient and I discussed the risks and benefits of surgery including the possibility of failure to alleviate symptoms, need for further surgery, DVT, PE, heart attack, stroke, anesthetic complications, infection, bleeding and others. The patient wished to proceed and provided consent.  BRIEF SUMMARY OF PROCEDURE:  After administration of preoperative antibiotics, the patient was taken to the operating room where general anesthesia was induced.  The right lower extremity was prepped and draped in usual sterile fashion.  Time-out was held.  I remade the old incision used for nail insertion and used medial parapatellar retinacular incision. The nail was extracted without complication, being careful to  avoid injury to the surrounding bone and soft tissues. There remained the posterior half of an old locking bolt but now TKA should be feasible.  Next I turned attention to the shaft where a completely separate plate was located. C-arm confirmed position and old incision was remade. I carried dissection down to bone but did not identify plate. C-arm confirmed that it was in that location but covered in large part by bone. Using osteotomes I was ultimately able to uncover the screws and plate. This extended operative time and difficulty considerably. I was careful to avoid deep fracture as I osteotomized or partially excised the overlying bone. I thoroughly irrigated after removal and reapproximated the deep periosteum and then the subcu and skin in standard layered fashion.    The knee wound was irrigated thoroughly, closed in standard layered fashion with 0 Vicryl for the retinaculum, 2-0 Vicryl and 2-0 nylon for the skin.  Sterile gently compressive dressing was applied and then Ace wrap from foot to thigh.  The patient was awakened from anesthesia and transported to PACU in stable condition. A PA student did assist me throughout with the implant removal by stabilizing the knee, preventing rotation during engagement of the extraction bolt, and wound closure for the knee. She also helped to retract during the partial excision of tibia during the separate plate removal.  PROGNOSIS:  Duane Collins will be weightbearing as tolerated with no ROM restrictions.  Ok to shower in 2 days. Oozing from the bone is anticipated. We will plan to see back for removal of sutures in 10 days. Formal DVT prophylaxis has not been recommended.     Duane Collins. Duane Collins,  M.D.   

## 2020-12-17 NOTE — Transfer of Care (Signed)
Immediate Anesthesia Transfer of Care Note  Patient: RYMAN RATHGEBER  Procedure(s) Performed: HARDWARE REMOVAL TIBIA (Right )  Patient Location: PACU  Anesthesia Type:General  Level of Consciousness: awake, alert  and oriented  Airway & Oxygen Therapy: Patient Spontanous Breathing and Patient connected to face mask oxygen  Post-op Assessment: Report given to RN and Post -op Vital signs reviewed and stable  Post vital signs: Reviewed and stable  Last Vitals:  Vitals Value Taken Time  BP 134/74   Temp    Pulse 68 12/17/20 1031  Resp 19 12/17/20 1031  SpO2 99 % 12/17/20 1031  Vitals shown include unvalidated device data.  Last Pain:  Vitals:   12/17/20 0645  TempSrc:   PainSc: 2       Patients Stated Pain Goal: 2 (12/17/20 0645)  Complications: No complications documented.

## 2020-12-17 NOTE — Anesthesia Postprocedure Evaluation (Signed)
Anesthesia Post Note  Patient: VERNEL LANGENDERFER  Procedure(s) Performed: HARDWARE REMOVAL TIBIA (Right )     Patient location during evaluation: PACU Anesthesia Type: General Level of consciousness: awake and alert Pain management: pain level controlled Vital Signs Assessment: post-procedure vital signs reviewed and stable Respiratory status: spontaneous breathing, nonlabored ventilation, respiratory function stable and patient connected to nasal cannula oxygen Cardiovascular status: blood pressure returned to baseline and stable Postop Assessment: no apparent nausea or vomiting Anesthetic complications: no   No complications documented.  Last Vitals:  Vitals:   12/17/20 1114 12/17/20 1130  BP: 126/75 111/70  Pulse: 68 67  Resp: 20 20  Temp:  (!) 36.1 C  SpO2: 93% 93%    Last Pain:  Vitals:   12/17/20 1130  TempSrc:   PainSc: 2                  Kennieth Rad

## 2020-12-17 NOTE — H&P (Signed)
[facility] H&P/Consult     Patient ID: Duane Collins MRN: [id number] DOB/AGE: 1961/03/01 60 y.o.  Chief Complaint: Symptomatic hardware right tibia HPI: Duane Collins is an 60 y.o. male.s/p multiple surgeries with continued right lower extremity pain. Requests hardware removal as may need TKA in the future and persistent symptoms currently.  Past Medical History:  Diagnosis Date  . Anemia   . Anxiety   . Arthritis knee  . Asthma    no inhaler, only when over exerting self  . Diabetes mellitus without complication (HCC)    Type II  . High cholesterol   . Hypertension   . Tibia/fibula fracture, shaft, right, open type I segmental 07/19/2018  . Vitamin D deficiency     Past Surgical History:  Procedure Laterality Date  . APPENDECTOMY    . HARDWARE REMOVAL Right 03/28/2019   Procedure: HARDWARE REMOVAL RIGHT TIBIA;  Surgeon: Myrene Galas, MD;  Location: [facility] OR;  Service: Orthopedics;  Laterality: Right;  . HARDWARE REMOVAL Right 10/16/2019   Procedure: HARDWARE REMOVAL RIGHT TIBIA;  Surgeon: Myrene Galas, MD;  Location[facility] OR;  Service: Orthopedics;  Laterality: Right;  . IM NAILING TIBIA Right 03/28/2019  . KNEE ARTHROSCOPY Right    Menisus tear  . KNEE ARTHROSCOPY Left    "cleaned"  . TIBIA IM NAIL INSERTION Right 07/19/2018   Procedure: INTRAMEDULLARY (IM) NAIL TIBIAL;  Surgeon: Teryl Lucy, MD;  Location: [facility] OR;  Service: Orthopedics;  Laterality: Right;  . TIBIA IM NAIL INSERTION Right 03/28/2019   Procedure: INTRAMEDULLARY (IM) NAIL RIGHT TIBIAL;  Surgeon: Myrene Galas, MD;  Location: [facility] OR;  Service: Orthopedics;  Laterality: Right;  . TIBIA IM NAIL INSERTION Right 10/16/2019   Procedure: RIGHT INTRAMEDULLARY TIBIAL NAIL EXCHANGE;  Surgeon: Myrene Galas, MD;  Location: [facility] OR;  Service: Orthopedics;  Laterality: Right;    History reviewed. No pertinent family history. Social History:  reports that he quit smoking about  22 years ago. He quit after 5.00 years of use. He has never used smokeless tobacco. He reports previous alcohol use. He reports that he does not use drugs.  Allergies:  Allergies  Allergen Reactions  . Oxycodone Anxiety and Palpitations    Medications Prior to Admission  Medication Sig Dispense Refill  . amLODipine (NORVASC) 10 MG tablet Take 10 mg by mouth daily.    . citalopram (CELEXA) 40 MG tablet Take 40 mg by mouth daily.    . Dulaglutide (TRULICITY) 0.75 MG/0.5ML SOPN Inject 0.75 mg into the skin every Sunday.    . ferrous sulfate 325 (65 FE) MG tablet Take 325 mg by mouth every Monday.  1  . lisinopril (ZESTRIL) 40 MG tablet Take 40 mg by mouth daily.    . metFORMIN (GLUCOPHAGE) 1000 MG tablet Take 1,000 mg by mouth 2 (two) times daily with a meal.  2    Results for orders placed or performed during the hospital encounter of 12/17/20 (from the past 48 hour(s))  CBC per protocol     Status: Abnormal   Collection Time: 12/17/20  6:07 AM  Result Value Ref Range   WBC 5.6 4.0 - 10.5 K/uL   RBC 4.10 (L) 4.22 - 5.81 MIL/uL   Hemoglobin 12.7 (L) 13.0 - 17.0 g/dL   HCT 63.0 16.0 - 10.9 %   MCV 97.1 80.0 - 100.0 fL   MCH 31.0 26.0 - 34.0 pg   MCHC 31.9 30.0 - 36.0 g/dL   RDW 32.3 55.7 -  15.5 %   Platelets 272 150 - 400 K/uL   nRBC 0.0 0.0 - 0.2 %    Comment: Performed at [facility], [address], [place], [place] [address]  Basic metabolic panel per protocol     Status: Abnormal   Collection Time: 12/17/20  6:07 AM  Result Value Ref Range   Sodium 138 135 - 145 mmol/L   Potassium 3.9 3.5 - 5.1 mmol/L   Chloride 103 98 - 111 mmol/L   CO2 27 22 - 32 mmol/L   Glucose, Bld 117 (H) 70 - 99 mg/dL    Comment: Glucose reference range applies only to samples taken after fasting for at least 8 hours.   BUN 11 6 - 20 mg/dL   Creatinine, Ser 6.07 0.61 - 1.24 mg/dL   Calcium 9.2 8.9 - 37.1 mg/dL   GFR, Estimated >06 >26 mL/min    Comment: (NOTE) Calculated using the CKD-EPI  Creatinine Equation (2021)    Anion gap 8 5 - 15    Comment: Performed at [facility], [address], [place], [place] [address]  Glucose, capillary     Status: Abnormal   Collection Time: 12/17/20  6:21 AM  Result Value Ref Range   Glucose-Capillary 114 (H) 70 - 99 mg/dL    Comment: Glucose reference range applies only to samples taken after fasting for at least 8 hours.   No results found.  ROS No recent fever, bleeding abnormalities, urologic dysfunction, GI problems, or weight gain.   Blood pressure (!) 158/74, pulse (!) 58, temperature (!) 97.2 F (36.2 C), temperature source Temporal, resp. rate 18, height 5\' 9"  (1.753 m), weight 105.4 kg, SpO2 97 %. Physical Exam NCAT RLE No traumatic wounds, ecchymosis, or rash  Nontender  No knee or ankle effusion  Knee stable to varus/ valgus and anterior/posterior stress  Sens DPN, SPN, TN intact but hyperparesthesia medial leg saphenous  Motor EHL, ext, flex, evers 5/5  DP 2+, PT 2+, No significant edema   Assessment/Plan Symptomatic hardware right leg  I discussed with the patient the risks and benefits of surgery, including the possibility of infection, nerve injury, vessel injury, wound breakdown, arthritis, DVT/ PE, loss of motion, failure to alleviate symptoms, and need for further surgery among others.  He acknowledged these risks and wished to proceed.  , MD [facility], [facility] [phone]  12/17/2020, 8:06 AM  [facility] [address] [place] [phone] (F)

## 2020-12-18 ENCOUNTER — Encounter (HOSPITAL_COMMUNITY): Payer: Self-pay | Admitting: Orthopedic Surgery

## 2021-05-29 IMAGING — DX DG TIBIA/FIBULA PORT 2V*R*
1 series · 4 of 4 positions shown · non-contrast
Comparison: None.

CLINICAL DATA: Post hardware removal

EXAM:
PORTABLE RIGHT TIBIA AND FIBULA - 2 VIEW

[Series 1: leg · 0.14mm/px · 4 of 4 slices shown]
[im 1/4]
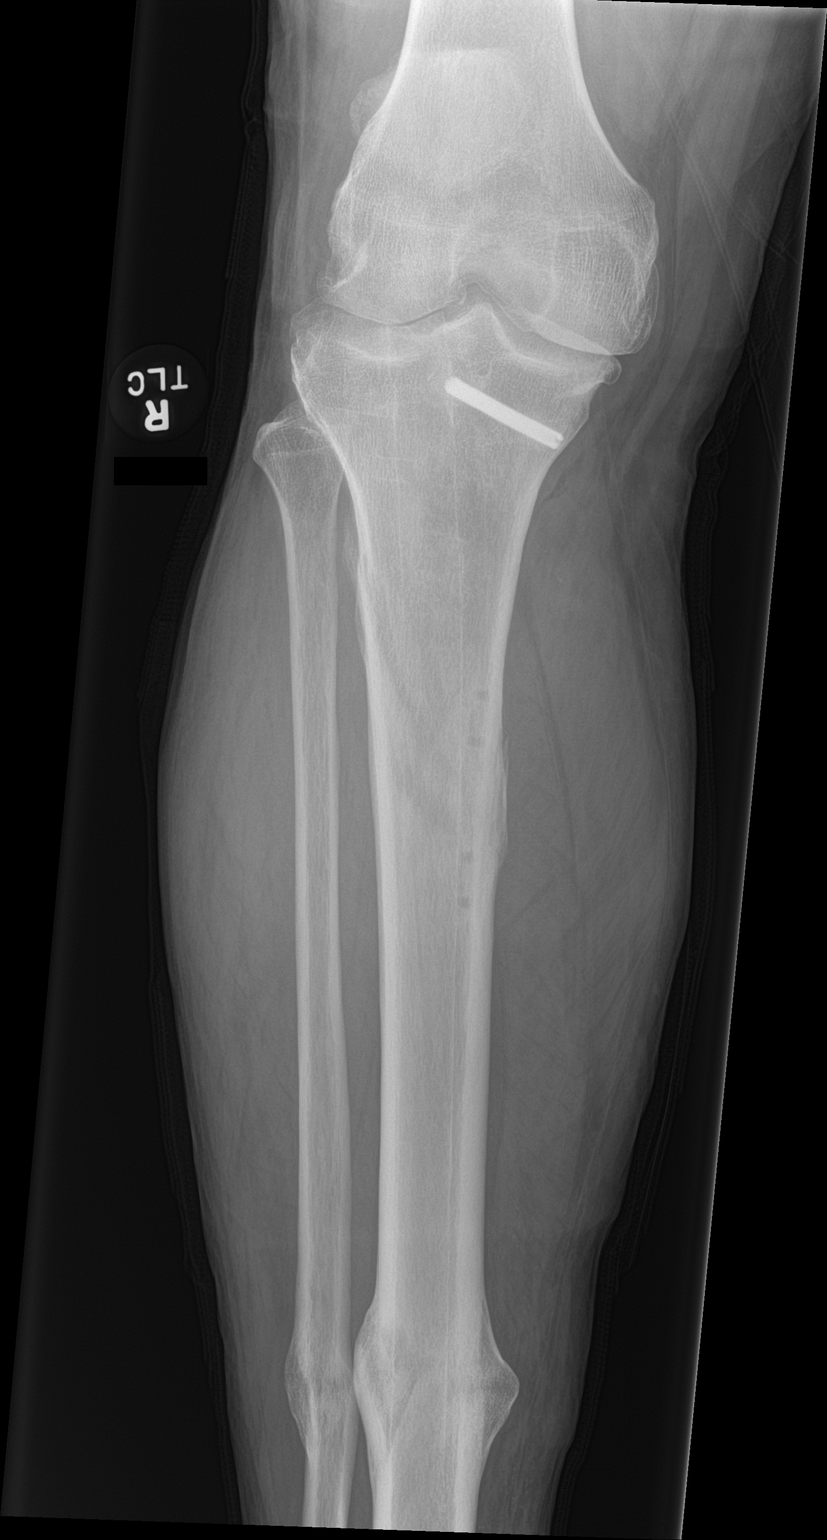
[im 2/4]
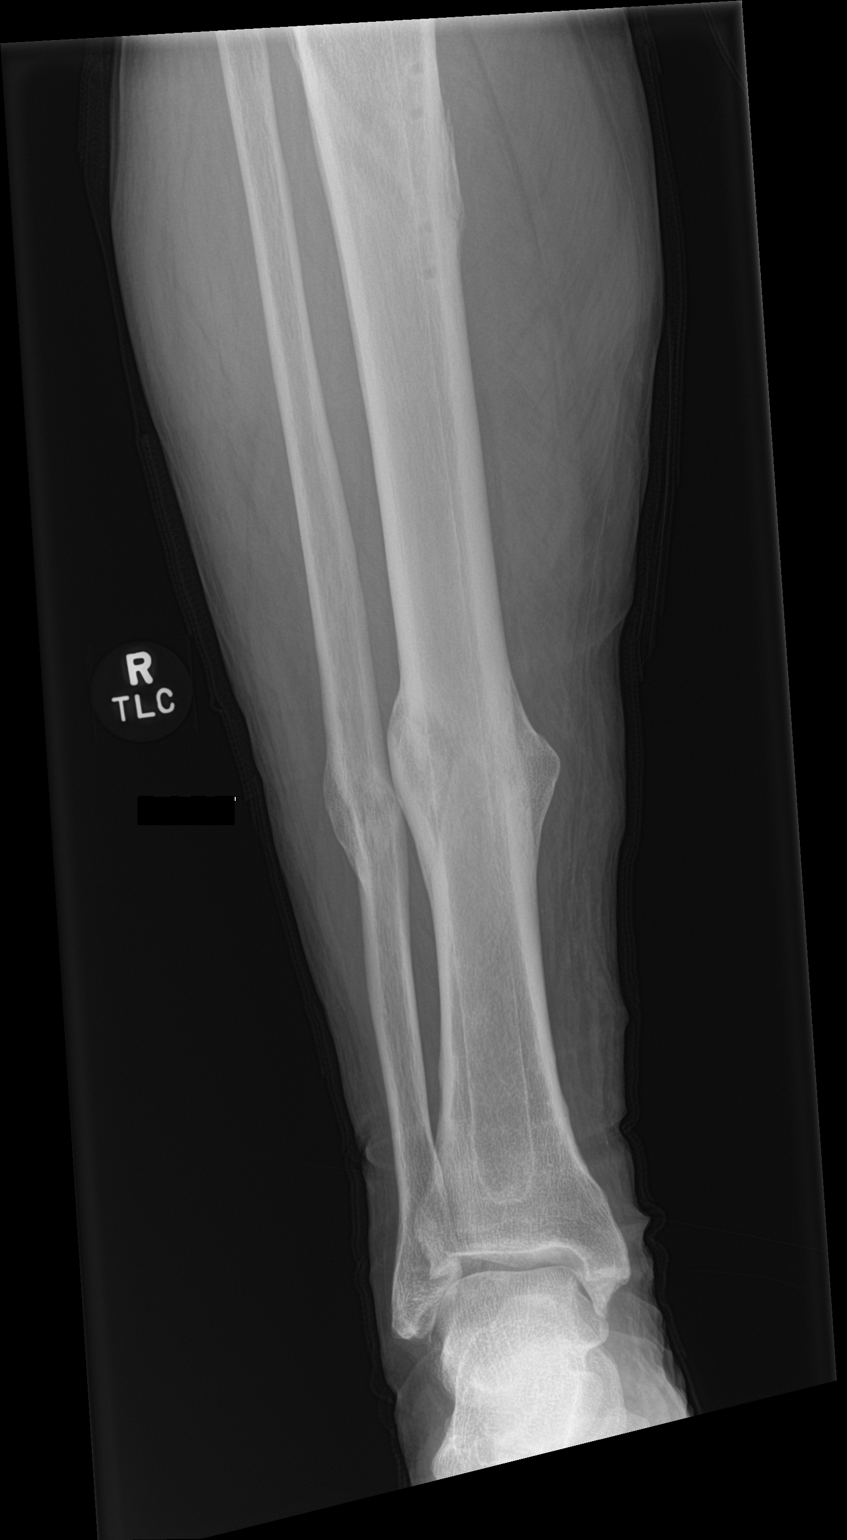
[im 3/4]
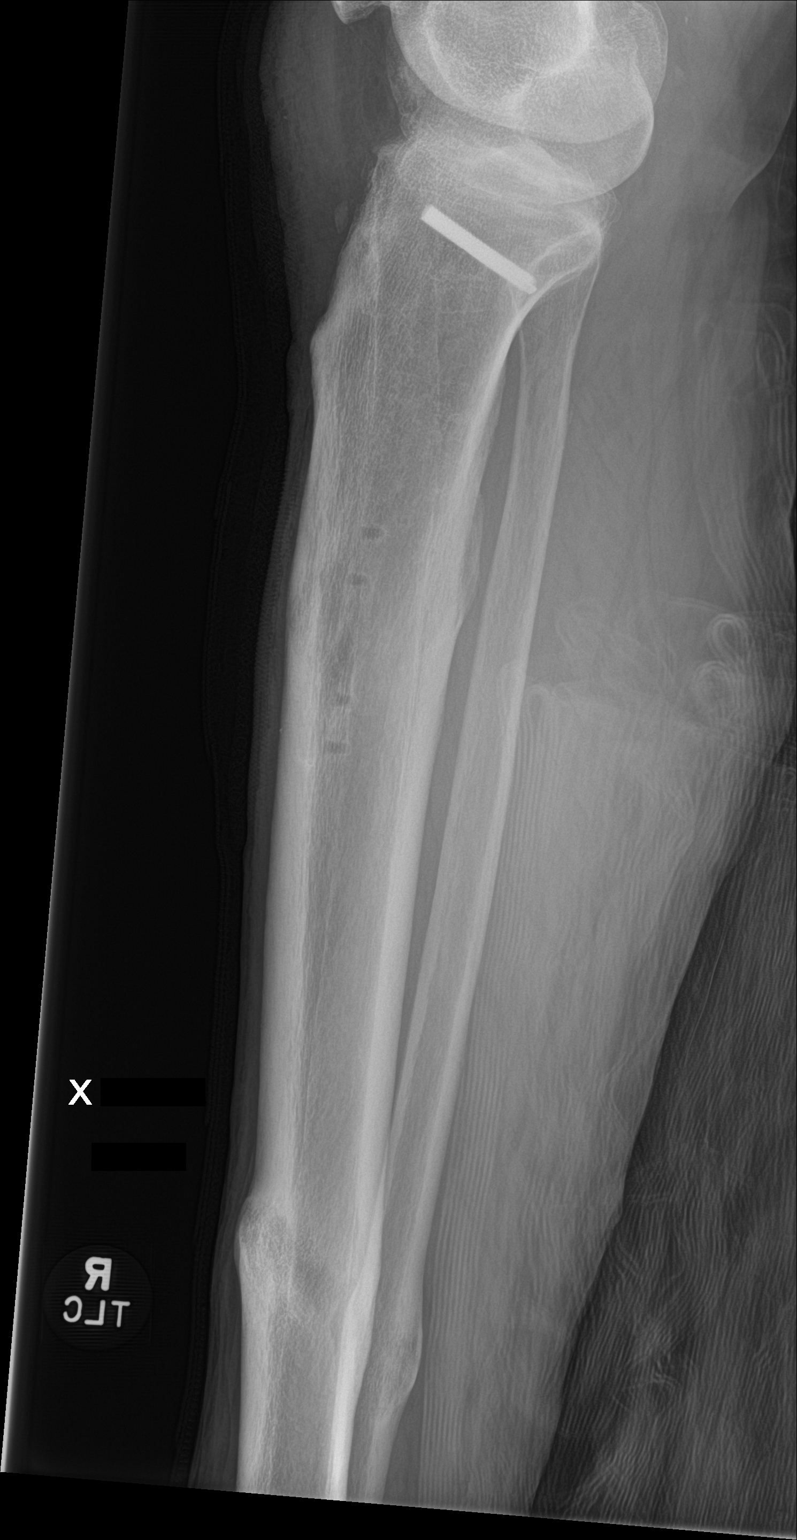
[im 4/4]
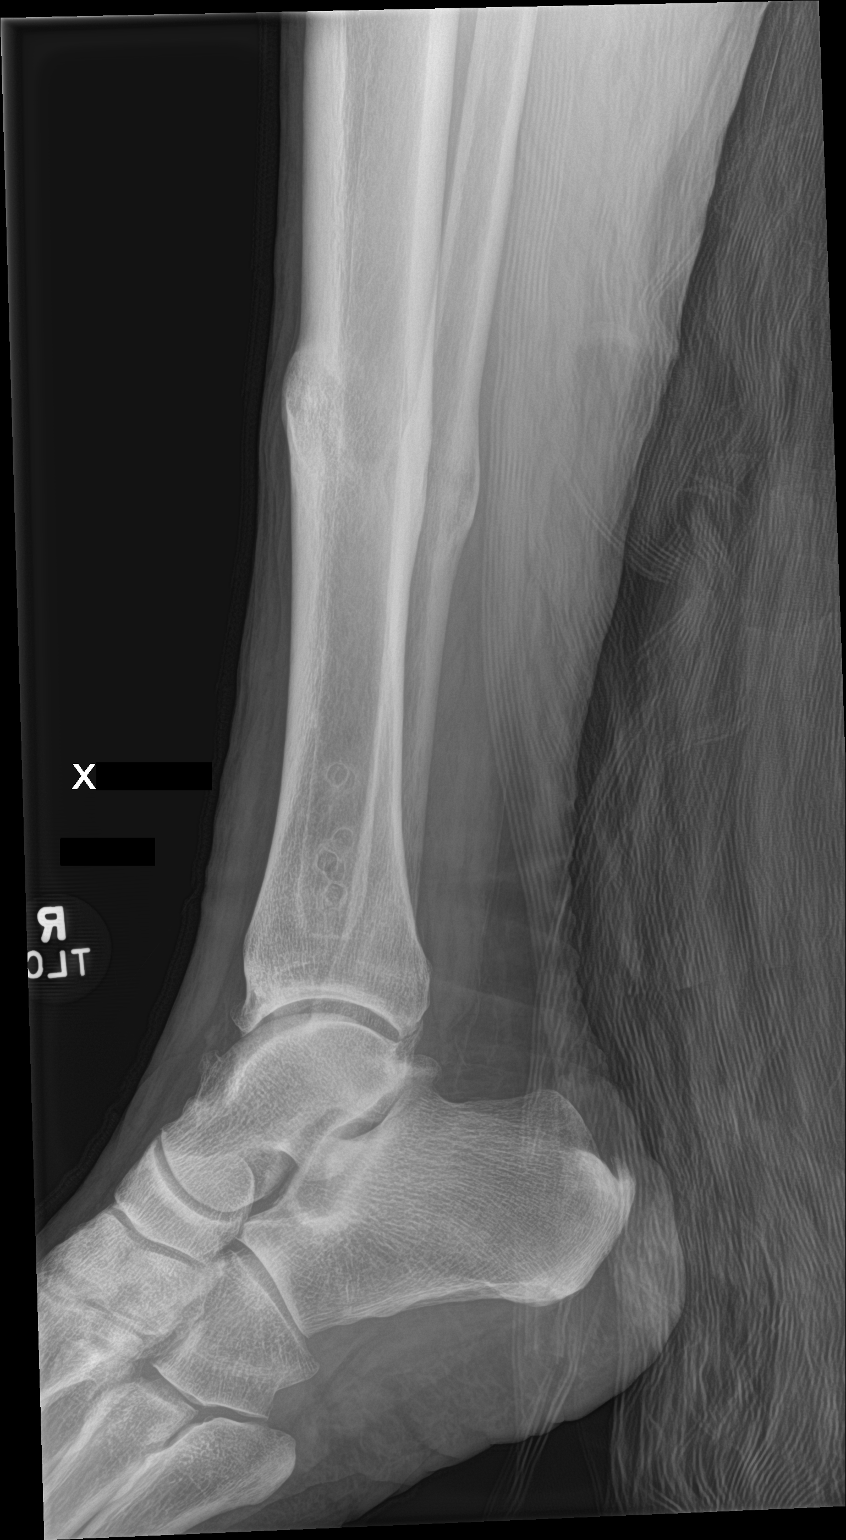

[4 of 4 positions shown; findings below may reference images not displayed]

FINDINGS: Old healed distal tibia and fibular shaft fractures are noted.
Previously seen intramedullary nail and plate and screw fixation
device within the tibia have been removed. A screw fragment remains
present in the proximal tibia. No bony complicating feature.
IMPRESSION: Interval removal of hardware with retained residual screw fragment
in the proximal tibia. No bony complicating feature.

## 2021-05-29 IMAGING — RF DG C-ARM 1-60 MIN
1 series · 8 of 8 positions shown · non-contrast
Comparison: Radiographs of the right tibia/fibula 10/16/2019.

CLINICAL DATA: Provided history: Elective surgery. Right hardware
removal tibia. Provided fluoroscopy time 22 seconds (1.44 mGy).

EXAM:
RIGHT TIBIA AND FIBULA - 2 VIEW; DG C-ARM 1-60 MIN

[Series 1: run · 8 of 8 slices shown]
[im 1/8]
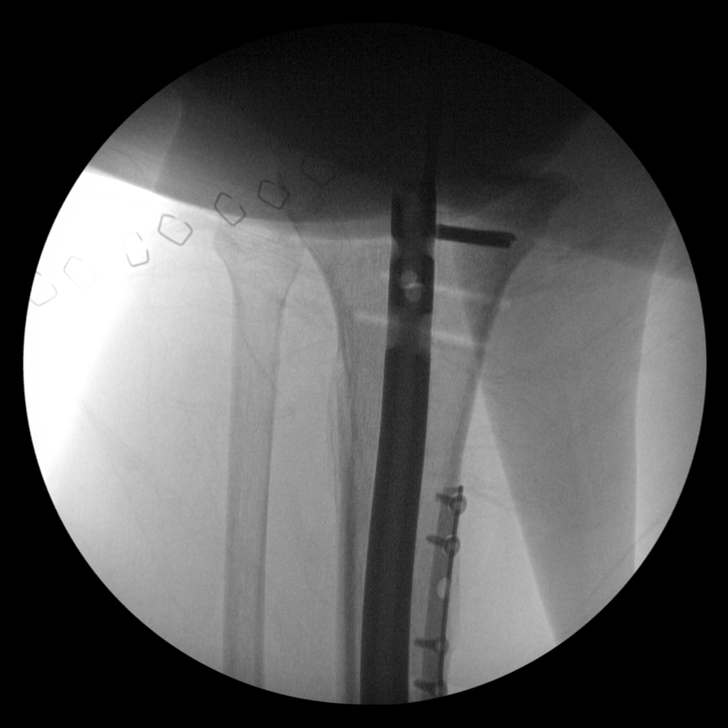
[im 2/8]
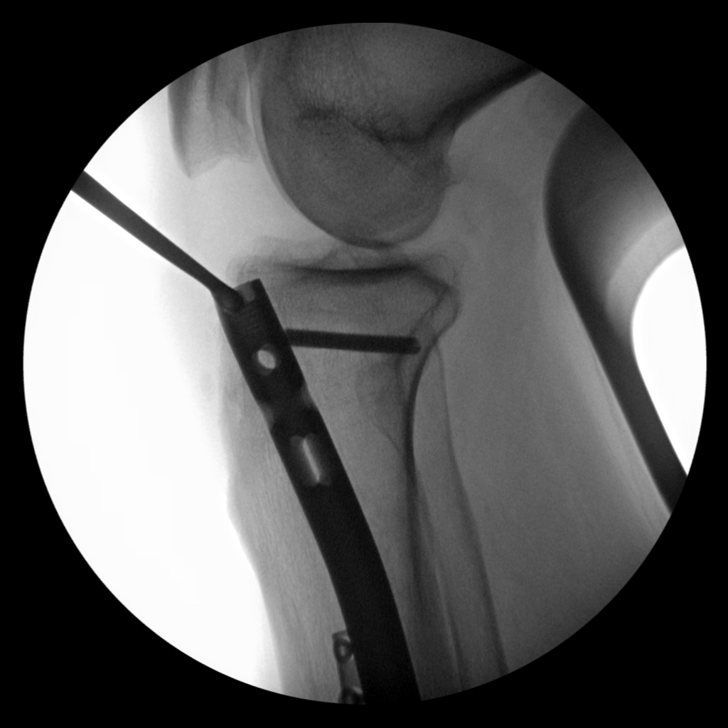
[im 3/8]
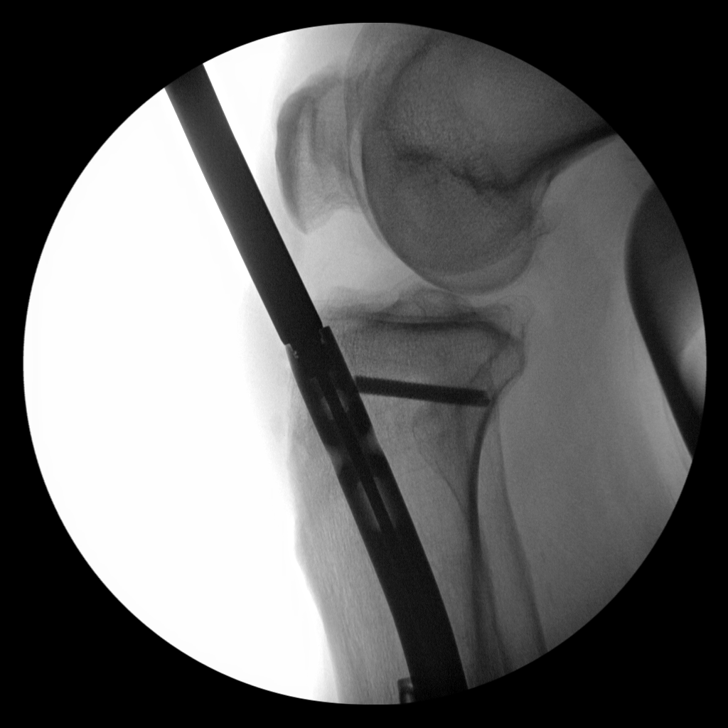
[im 4/8]
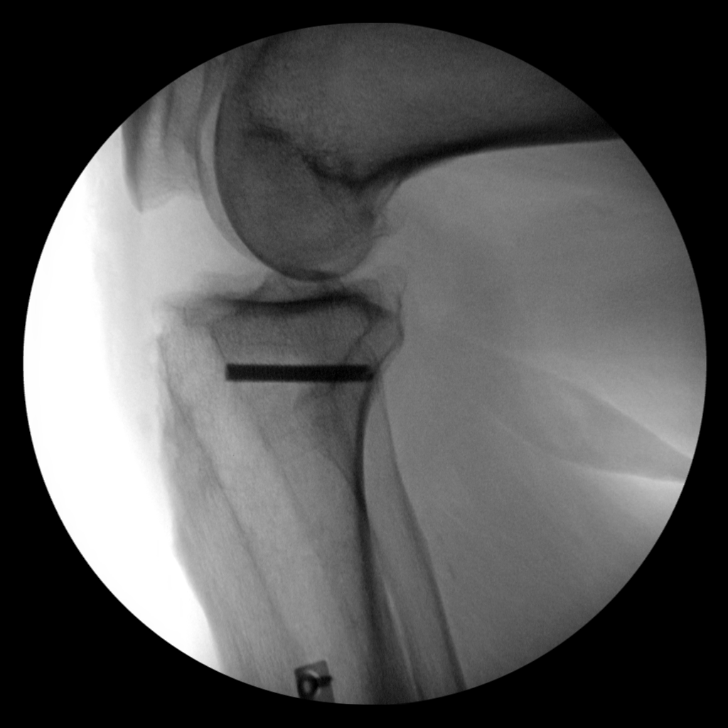
[im 5/8]
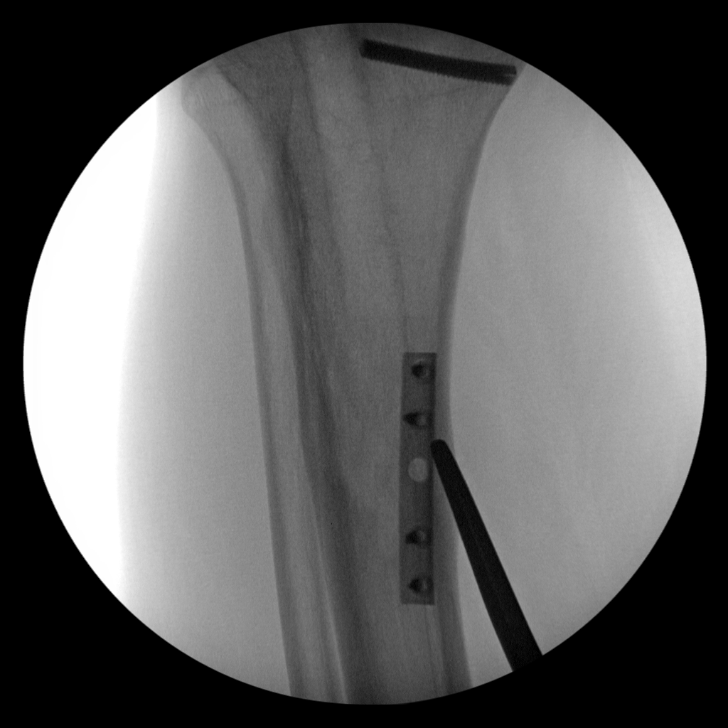
[im 6/8]
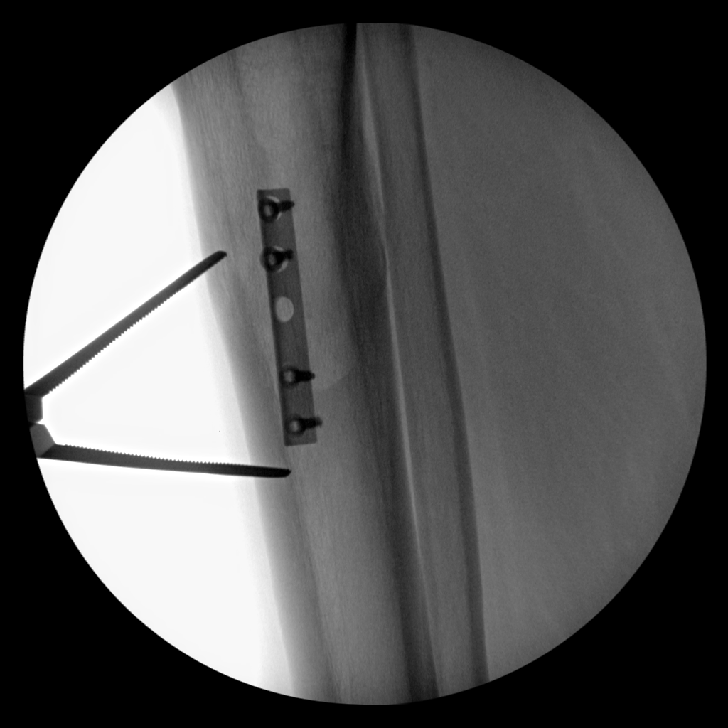
[im 7/8]
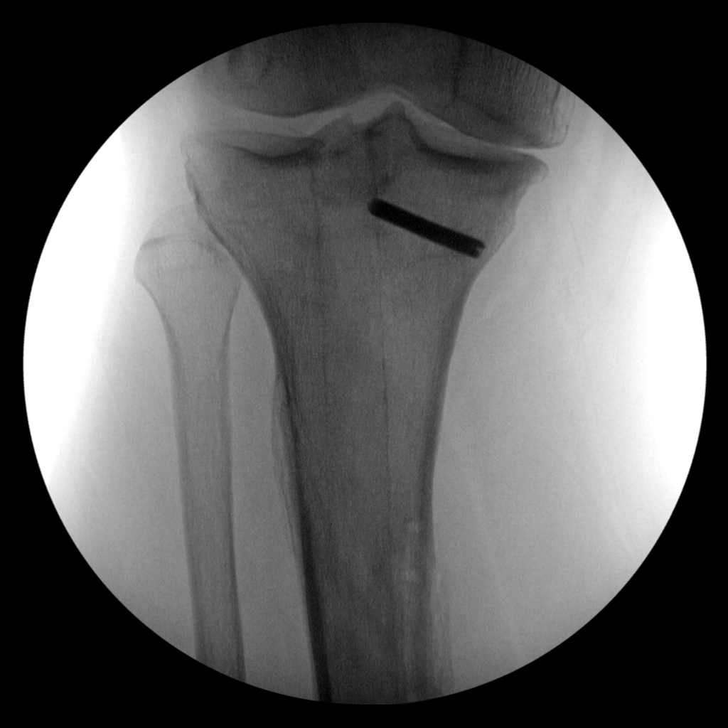
[im 8/8]
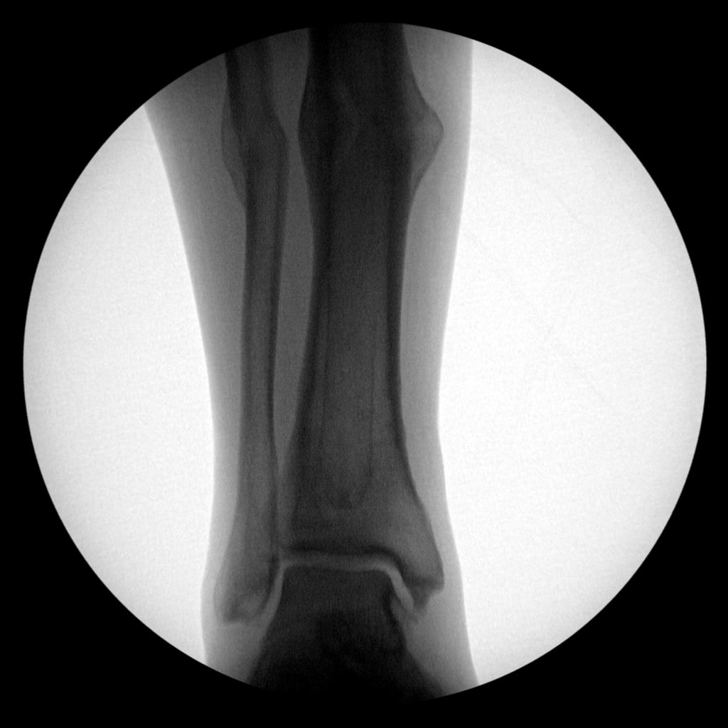

[8 of 8 positions shown; findings below may reference images not displayed]

FINDINGS: Nine intraoperative fluoroscopic images of the right tibia/fibula
are submitted. An intramedullary nail is present within the tibia on
the initial images, but not on the later images. Similarly, a medial
tibial plate and screws are demonstrated on earlier images, but not
on the later images. A screw is present within the medial aspect of
the proximal tibial metaphysis on all provided images. Correlate
with the procedural history. Redemonstrated posttraumatic
deformities of the proximal and distal tibia and distal fibula.
IMPRESSION: Nine intraoperative fluoroscopic images from tibial hardware
removal, as described.

## 2023-05-10 ENCOUNTER — Encounter: Payer: Self-pay | Admitting: Physician Assistant

## 2023-12-13 ENCOUNTER — Ambulatory Visit (AMBULATORY_SURGERY_CENTER): Payer: Medicare Other

## 2023-12-13 VITALS — Ht 69.0 in | Wt 200.0 lb

## 2023-12-13 DIAGNOSIS — Z1211 Encounter for screening for malignant neoplasm of colon: Secondary | ICD-10-CM

## 2023-12-13 MED ORDER — SUFLAVE 178.7 G PO SOLR: 1.0000 | Freq: Once | ORAL | 0 refills | Status: AC

## 2023-12-13 NOTE — Progress Notes (Signed)
 No egg or soy allergy known to patient  No issues known to pt with past sedation with any surgeries or procedures Patient denies ever being told they had issues or difficulty with intubation  No FH of Malignant Hyperthermia Pt is not on home 02  Pt is not on blood thinners  Pt denies issues with chronic constipation  No A fib or A flutter Have any cardiac testing pending--no Ambulates independently

## 2023-12-29 ENCOUNTER — Encounter: Payer: Self-pay | Admitting: Gastroenterology

## 2023-12-31 ENCOUNTER — Other Ambulatory Visit (INDEPENDENT_AMBULATORY_CARE_PROVIDER_SITE_OTHER): Payer: BLUE CROSS/BLUE SHIELD

## 2023-12-31 ENCOUNTER — Ambulatory Visit (AMBULATORY_SURGERY_CENTER): Payer: BLUE CROSS/BLUE SHIELD | Admitting: Gastroenterology

## 2023-12-31 ENCOUNTER — Other Ambulatory Visit: Payer: Self-pay

## 2023-12-31 ENCOUNTER — Encounter: Payer: Self-pay | Admitting: Gastroenterology

## 2023-12-31 VITALS — BP 137/71 | HR 51 | Temp 98.1°F | Resp 13 | Ht 69.0 in | Wt 200.0 lb

## 2023-12-31 DIAGNOSIS — K529 Noninfective gastroenteritis and colitis, unspecified: Secondary | ICD-10-CM | POA: Diagnosis not present

## 2023-12-31 DIAGNOSIS — K51919 Ulcerative colitis, unspecified with unspecified complications: Secondary | ICD-10-CM | POA: Diagnosis not present

## 2023-12-31 DIAGNOSIS — Z1211 Encounter for screening for malignant neoplasm of colon: Secondary | ICD-10-CM

## 2023-12-31 DIAGNOSIS — K5730 Diverticulosis of large intestine without perforation or abscess without bleeding: Secondary | ICD-10-CM | POA: Diagnosis not present

## 2023-12-31 LAB — COMPREHENSIVE METABOLIC PANEL
ALT: 10 U/L (ref 0–53)
AST: 13 U/L (ref 0–37)
Albumin: 4.2 g/dL (ref 3.5–5.2)
Alkaline Phosphatase: 61 U/L (ref 39–117)
BUN: 7 mg/dL (ref 6–23)
CO2: 29 meq/L (ref 19–32)
Calcium: 9.1 mg/dL (ref 8.4–10.5)
Chloride: 106 meq/L (ref 96–112)
Creatinine, Ser: 1.04 mg/dL (ref 0.40–1.50)
GFR: 78.14 mL/min (ref >60.00)
Glucose, Bld: 123 mg/dL — ABNORMAL HIGH (ref 70–99)
Potassium: 3.8 meq/L (ref 3.5–5.1)
Sodium: 142 meq/L (ref 135–145)
Total Bilirubin: 0.4 mg/dL (ref 0.2–1.2)
Total Protein: 7.5 g/dL (ref 6.0–8.3)

## 2023-12-31 LAB — CBC
HCT: 37 % — ABNORMAL LOW (ref 39.0–52.0)
Hemoglobin: 12.3 g/dL — ABNORMAL LOW (ref 13.0–17.0)
MCHC: 33.1 g/dL (ref 30.0–36.0)
MCV: 99.6 fL (ref 78.0–100.0)
Platelets: 372 10*3/uL (ref 150.0–400.0)
RBC: 3.72 Mil/uL — ABNORMAL LOW (ref 4.22–5.81)
RDW: 15.3 % (ref 11.5–15.5)
WBC: 5.4 10*3/uL (ref 4.0–10.5)

## 2023-12-31 LAB — C-REACTIVE PROTEIN: CRP: <1 mg/dL (ref 0.5–20.0)

## 2023-12-31 MED ORDER — SODIUM CHLORIDE 0.9 % IV SOLN
500.0000 mL | INTRAVENOUS | Status: DC
Start: 2023-12-31 — End: 2023-12-31

## 2023-12-31 MED ORDER — MESALAMINE 4 G RE ENEM: 4.0000 g | ENEMA | Freq: Every day | RECTAL | 6 refills | Status: DC

## 2023-12-31 NOTE — Progress Notes (Signed)
 [facility] History and Physical   Primary Care Physician:  Patient, No Pcp Per   Reason for Procedure:   CRC screening. L sided UC  Plan:    colon     HPI: Duane Collins is a 63 y.o. male    Past Medical History:  Diagnosis Date   Anemia    Anxiety    Arthritis knee   Asthma    no inhaler, only when over exerting self   Diabetes mellitus without complication (HCC)    Type II   High cholesterol    Hypertension    Tibia/fibula fracture, shaft, right, open type I segmental 07/19/2018   Vitamin D deficiency     Past Surgical History:  Procedure Laterality Date   APPENDECTOMY     COLONOSCOPY     HARDWARE REMOVAL Right 03/28/2019   Procedure: HARDWARE REMOVAL RIGHT TIBIA;  Surgeon: Myrene Galas, MD;  Location: [facility] OR;  Service: Orthopedics;  Laterality: Right;   HARDWARE REMOVAL Right 10/16/2019   Procedure: HARDWARE REMOVAL RIGHT TIBIA;  Surgeon: Myrene Galas, MD;  Location: [facility] OR;  Service: Orthopedics;  Laterality: Right;   HARDWARE REMOVAL Right 12/17/2020   Procedure: HARDWARE REMOVAL TIBIA;  Surgeon: Myrene Galas, MD;  Location: [facility] OR;  Service: Orthopedics;  Laterality: Right;   IM NAILING TIBIA Right 03/28/2019   KNEE ARTHROSCOPY Right    Menisus tear   KNEE ARTHROSCOPY Left    "cleaned"   TIBIA IM NAIL INSERTION Right 07/19/2018   Procedure: INTRAMEDULLARY (IM) NAIL TIBIAL;  Surgeon: Teryl Lucy, MD;  Location: [facility] OR;  Service: Orthopedics;  Laterality: Right;   TIBIA IM NAIL INSERTION Right 03/28/2019   Procedure: INTRAMEDULLARY (IM) NAIL RIGHT TIBIAL;  Surgeon: Myrene Galas, MD;  Location: [facility] OR;  Service: Orthopedics;  Laterality: Right;   TIBIA IM NAIL INSERTION Right 10/16/2019   Procedure: RIGHT INTRAMEDULLARY TIBIAL NAIL EXCHANGE;  Surgeon: Myrene Galas, MD;  Location: [facility] OR;  Service: Orthopedics;  Laterality: Right;    Prior to Admission medications   Medication Sig Start Date End Date Taking? Authorizing Provider   aspirin EC 81 MG tablet Take 81 mg by mouth daily. 07/03/21  Yes [provider]  citalopram (CELEXA) 40 MG tablet Take 40 mg by mouth daily.   Yes [provider]  glipiZIDE (GLUCOTROL XL) 2.5 MG 24 hr tablet Take 2.5 mg by mouth daily.   Yes [provider]  lisinopril (ZESTRIL) 40 MG tablet Take 40 mg by mouth daily.   Yes [provider]  losartan (COZAAR) 100 MG tablet Take 1 tablet by mouth daily. 09/29/22  Yes [provider]  metFORMIN (GLUCOPHAGE) 1000 MG tablet Take 1,000 mg by mouth 2 (two) times daily with a meal. 05/12/18  Yes [provider]  metoprolol tartrate (LOPRESSOR) 25 MG tablet Take 25 mg by mouth 2 (two) times daily.   Yes [provider]  Zinc Picolinate POWD Take 50 mg by mouth daily.   Yes [provider]  amLODipine (NORVASC) 10 MG tablet Take 10 mg by mouth daily. Patient not taking: Reported on 12/13/2023 11/13/20   [provider]  ascorbic acid (C 500/ROSE HIPS) 500 MG tablet Take 1 tablet by mouth daily. 06/21/19   [provider]  ferrous sulfate 325 (65 FE) MG tablet Take 325 mg by mouth every Monday. 05/26/18   [provider]  nitroGLYCERIN (NITROSTAT) 0.4 MG SL tablet Place 0.4 mg under the tongue every 5 (five) minutes as needed. Patient not  taking: Reported on 12/13/2023 07/03/21   [provider]  OZEMPIC, 0.25 OR 0.5 MG/DOSE, 2 MG/3ML SOPN Inject 2.5 mg as directed once a week.    [provider]    Current Outpatient Medications  Medication Sig Dispense Refill   aspirin EC 81 MG tablet Take 81 mg by mouth daily.     citalopram (CELEXA) 40 MG tablet Take 40 mg by mouth daily.     glipiZIDE (GLUCOTROL XL) 2.5 MG 24 hr tablet Take 2.5 mg by mouth daily.     lisinopril (ZESTRIL) 40 MG tablet Take 40 mg by mouth daily.     losartan (COZAAR) 100 MG tablet Take 1 tablet by mouth daily.     metFORMIN (GLUCOPHAGE) 1000 MG tablet Take 1,000 mg by mouth  2 (two) times daily with a meal.  2   metoprolol tartrate (LOPRESSOR) 25 MG tablet Take 25 mg by mouth 2 (two) times daily.     Zinc Picolinate POWD Take 50 mg by mouth daily.     amLODipine (NORVASC) 10 MG tablet Take 10 mg by mouth daily. (Patient not taking: Reported on 12/13/2023)     ascorbic acid (C 500/ROSE HIPS) 500 MG tablet Take 1 tablet by mouth daily.     ferrous sulfate 325 (65 FE) MG tablet Take 325 mg by mouth every Monday.  1   nitroGLYCERIN (NITROSTAT) 0.4 MG SL tablet Place 0.4 mg under the tongue every 5 (five) minutes as needed. (Patient not taking: Reported on 12/13/2023)     OZEMPIC, 0.25 OR 0.5 MG/DOSE, 2 MG/3ML SOPN Inject 2.5 mg as directed once a week.     Current Facility-Administered Medications  Medication Dose Route Frequency Provider Last Rate Last Admin   0.9 %  sodium chloride infusion  500 mL Intravenous Continuous Lynann Bologna, MD        Allergies as of 12/31/2023 - Review Complete 12/31/2023  Allergen Reaction Noted   Oxycodone Anxiety and Palpitations 11/27/2013    Family History  Problem Relation Age of Onset   Colon cancer Neg Hx    Colon polyps Neg Hx    Esophageal cancer Neg Hx    Rectal cancer Neg Hx    Stomach cancer Neg Hx     Social History   Socioeconomic History   Marital status: Married    Spouse name: Not on file   Number of children: Not on file   Years of education: Not on file   Highest education level: Not on file  Occupational History   Not on file  Tobacco Use   Smoking status: Former    Current packs/day: 0.00    Types: Cigarettes    Start date: 49    Quit date: 2000    Years since quitting: 25.1   Smokeless tobacco: Never  Vaping Use   Vaping status: Never Used  Substance and Sexual Activity   Alcohol use: Not Currently    Comment: quit 25 yrs. ago   Drug use: Never   Sexual activity: Not on file  Other Topics Concern   Not on file  Social History Narrative   Not on file   Social Drivers of Health    Financial Resource Strain: Not on file  Food Insecurity: Not on file  Transportation Needs: Not on file  Physical Activity: Not on file  Stress: Not on file  Social Connections: Not on file  Intimate Partner Violence: Not on file    Review of Systems: Positive for none All  other review of systems negative except as mentioned in the HPI.  Physical Exam: Vital signs in last 24 hours: @VSRANGES @   General:   Alert,  Well-developed, well-nourished, pleasant and cooperative in NAD Lungs:  Clear throughout to auscultation.   Heart:  Regular rate and rhythm; no murmurs, clicks, rubs,  or gallops. Abdomen:  Soft, nontender and nondistended. Normal bowel sounds.   Neuro/Psych:  Alert and cooperative. Normal mood and affect. A and O x 3    No significant changes were identified.  The patient continues to be an appropriate candidate for the planned procedure and anesthesia.   Edman Circle, MD. [facility] 12/31/2023 3:08 PM@

## 2023-12-31 NOTE — Op Note (Signed)
 [facility] Patient Name: Duane Collins Procedure Date: 12/31/2023 3:00 PM MRN: [id number] Endoscopist: Lynann Bologna , MD, [id number] Age: 63 Referring MD:  Date of Birth: 1960-11-19 Gender: Male Account #: [id number] Procedure:                Colonoscopy Indications:              Screening for colorectal malignant neoplasm. H/O                            ulcerative proctosigmoiditis. Medicines:                Monitored Anesthesia Care Procedure:                Pre-Anesthesia Assessment:                           - Prior to the procedure, a History and Physical                            was performed, and patient medications and                            allergies were reviewed. The patient's tolerance of                            previous anesthesia was also reviewed. The risks                            and benefits of the procedure and the sedation                            options and risks were discussed with the patient.                            All questions were answered, and informed consent                            was obtained. Prior Anticoagulants: The patient has                            taken no anticoagulant or antiplatelet agents. ASA                            Grade Assessment: II - A patient with mild systemic                            disease. After reviewing the risks and benefits,                            the patient was deemed in satisfactory condition to                            undergo the procedure.  After obtaining informed consent, the colonoscope                            was passed under direct vision. Throughout the                            procedure, the patient's blood pressure, pulse, and                            oxygen saturations were monitored continuously. The                            Colonoscope was introduced through the anus and                            advanced to the 2 cm into the  ileum. The                            colonoscopy was performed without difficulty. The                            patient tolerated the procedure well. The quality                            of the bowel preparation was adequate to identify                            polyps. The terminal ileum, ileocecal valve,                            appendiceal orifice, and rectum were photographed. Scope In: 3:13:48 PM Scope Out: 3:29:31 PM Scope Withdrawal Time: 0 hours 12 minutes 4 seconds  Total Procedure Duration: 0 hours 15 minutes 43 seconds  Findings:                 Inflammation was found in a continuous and                            circumferential pattern from the rectum to the                            sigmoid colon, up to 25 cm from the anal verge.                            This was graded as Mayo Score 3 (severe, with                            spontaneous bleeding, ulcerations). Biopsies were                            taken with a cold forceps for histology. Estimated                            blood  loss: none.                           A few small-mouthed diverticula were found in the                            sigmoid colon.                           The exam was otherwise normal throughout the                            examined colon with well-preserved vascular pattern                            beyond 25 cm in the rest of the colon. Multiple                            biopsies were obtained from the ascending colon and                            transverse colon.                           The terminal ileum appeared normal.                           The perianal and digital rectal examinations were                            normal. Pertinent negatives include normal                            sphincter tone. No perianal fistulas. Complications:            No immediate complications. Estimated Blood Loss:     Estimated blood loss: none. 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 Biopsied.                           - Very mild sigmoid diverticulosis.                           - The examined remaining colon and TI was normal. Recommendation:           - Patient has a contact number available for                            emergencies. The signs and symptoms of potential                            delayed complications were discussed with the  patient. Return to normal activities tomorrow.                            Written discharge instructions were provided to the                            patient.                           - Resume previous diet.                           - Continue present medications.                           - Rowasa enemas 4 g PR QD #30, 6RF                           - Await pathology results.                           - Check CBC, CMP, CRP, TB Gold, HbsAg today.                           - Do recommend shingles vaccine from PCP                           - Return to GI clinic in 6 weeks 9[facility] clinic or                            mine). May need Biologics.                           - The findings and recommendations were discussed                            with the patient's family. Lynann Bologna, MD 12/31/2023 3:42:21 PM This report has been signed electronically.

## 2023-12-31 NOTE — Progress Notes (Signed)
 Pt's states no medical or surgical changes since previsit or office visit.   Pt  took Ozempic 6 days ago. MD made aware.

## 2023-12-31 NOTE — Progress Notes (Signed)
 Sedate, gd SR, tolerated procedure well, VSS, report to RN

## 2023-12-31 NOTE — Patient Instructions (Addendum)
 Thank you for letting us take care of your healthcare needs today. Please see handouts given to you on Diverticulosis. Labs today. Await pathology results. Return to the clinic in 6 weeks for follow up. Please call and schedule. New Rx for Rowasa enema daily sent to pharmacy.   YOU HAD AN ENDOSCOPIC PROCEDURE TODAY AT THE [facility]:   Refer to the procedure report that was given to you for any specific questions about what was found during the examination.  If the procedure report does not answer your questions, please call your gastroenterologist to clarify.  If you requested that your care partner not be given the details of your procedure findings, then the procedure report has been included in a sealed envelope for you to review at your convenience later.  YOU SHOULD EXPECT: Some feelings of bloating in the abdomen. Passage of more gas than usual.  Walking can help get rid of the air that was put into your GI tract during the procedure and reduce the bloating. If you had a lower endoscopy (such as a colonoscopy or flexible sigmoidoscopy) you may notice spotting of blood in your stool or on the toilet paper. If you underwent a bowel prep for your procedure, you may not have a normal bowel movement for a few days.  Please Note:  You might notice some irritation and congestion in your nose or some drainage.  This is from the oxygen used during your procedure.  There is no need for concern and it should clear up in a day or so.  SYMPTOMS TO REPORT IMMEDIATELY:  Following lower endoscopy (colonoscopy or flexible sigmoidoscopy):  Excessive amounts of blood in the stool  Significant tenderness or worsening of abdominal pains  Swelling of the abdomen that is new, acute  Fever of 100F or higher For urgent or emergent issues, a gastroenterologist can be reached at any hour by calling [phone]. Do not use MyChart messaging for urgent concerns.    DIET:  We do recommend a small  meal at first, but then you may proceed to your regular diet.  Drink plenty of fluids but you should avoid alcoholic beverages for 24 hours.  ACTIVITY:  You should plan to take it easy for the rest of today and you should NOT DRIVE or use heavy machinery until tomorrow (because of the sedation medicines used during the test).    FOLLOW UP: Our staff will call the number listed on your records the next business day following your procedure.  We will call around 7:15- 8:00 am to check on you and address any questions or concerns that you may have regarding the information given to you following your procedure. If we do not reach you, we will leave a message.     If any biopsies were taken you will be contacted by phone or by letter within the next 1-3 weeks.  Please call us at [phone] if you have not heard about the biopsies in 3 weeks.    SIGNATURES/CONFIDENTIALITY: You and/or your care partner have signed paperwork which will be entered into your electronic medical record.  These signatures attest to the fact that that the information above on your After Visit Summary has been reviewed and is understood.  Full responsibility of the confidentiality of this discharge information lies with you and/or your care-partner.

## 2023-12-31 NOTE — Progress Notes (Signed)
 Called to room to assist during endoscopic procedure.  Patient ID and intended procedure confirmed with present staff. Received instructions for my participation in the procedure from the performing physician.

## 2024-01-03 ENCOUNTER — Telehealth: Payer: Self-pay

## 2024-01-03 NOTE — Telephone Encounter (Signed)
  Follow up Call-     12/31/2023    2:52 PM  Call back number  Post procedure Call Back phone  # [phone]  Permission to leave phone message Yes     Patient questions:  Do you have a fever, pain , or abdominal swelling? No. Pain Score  0 *  Have you tolerated food without any problems? Yes.    Have you been able to return to your normal activities? Yes.    Do you have any questions about your discharge instructions: Diet   No. Medications  No. Follow up visit  No.  Do you have questions or concerns about your Care? No.  Actions: * If pain score is 4 or above: No action needed, pain <4.

## 2024-01-03 NOTE — Telephone Encounter (Signed)
 Can I get a PA done on Rowasa enema done please as soon as possible?

## 2024-01-04 ENCOUNTER — Other Ambulatory Visit (HOSPITAL_COMMUNITY): Payer: Self-pay

## 2024-01-04 ENCOUNTER — Telehealth: Payer: Self-pay

## 2024-01-04 LAB — QUANTIFERON-TB GOLD PLUS
Mitogen-NIL: >10 [IU]/mL
NIL: 0.04 [IU]/mL
QuantiFERON-TB Gold Plus: NEGATIVE
TB1-NIL: 0 [IU]/mL
TB2-NIL: 0 [IU]/mL

## 2024-01-04 LAB — HEPATITIS B SURFACE ANTIGEN: Hepatitis B Surface Ag: NONREACTIVE

## 2024-01-04 NOTE — Telephone Encounter (Signed)
 Spoke with pharmacy and they said the can fill the medication for a 30 day supply but he will need a PA for his refills so please advise on how yall will do the PA

## 2024-01-04 NOTE — Telephone Encounter (Signed)
 Pharmacy Patient Advocate Encounter  Insurance verification completed.   The patient is insured through [employer] test claim for Rowasa 4 g enema. Currently a quantity of 1800 ml is a 30 day supply and the co-pay is 4.90 . The current 30 day co-pay is, $4.90.  No PA needed at this time.  This test claim was processed through [facility]- copay amounts may vary at other pharmacies due to pharmacy/plan contracts, or as the patient moves through the different stages of their insurance plan.

## 2024-01-04 NOTE — Telephone Encounter (Signed)
 No prior auth needed at this time, per test claim.

## 2024-01-05 LAB — SURGICAL PATHOLOGY

## 2024-01-10 ENCOUNTER — Encounter: Payer: Self-pay | Admitting: Gastroenterology

## 2024-01-13 ENCOUNTER — Other Ambulatory Visit (HOSPITAL_COMMUNITY): Payer: Self-pay

## 2024-02-22 ENCOUNTER — Ambulatory Visit: Admitting: Gastroenterology

## 2024-02-22 ENCOUNTER — Encounter: Payer: Self-pay | Admitting: Gastroenterology

## 2024-02-22 VITALS — BP 112/62 | HR 52 | Ht 69.0 in | Wt 198.0 lb

## 2024-02-22 DIAGNOSIS — K5130 Ulcerative (chronic) rectosigmoiditis without complications: Secondary | ICD-10-CM | POA: Diagnosis not present

## 2024-02-22 DIAGNOSIS — K51319 Ulcerative (chronic) rectosigmoiditis with unspecified complications: Secondary | ICD-10-CM

## 2024-02-22 MED ORDER — MESALAMINE 4 G RE ENEM: 4.0000 g | ENEMA | Freq: Every day | RECTAL | 6 refills | Status: DC

## 2024-02-22 NOTE — Progress Notes (Signed)
 Chief Complaint: FU  Referring Provider:  No ref. provider found      ASSESSMENT AND PLAN;   #1. 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   HPI:    Duane Collins is a 63 y.o. male  For follow-up.  History of Present Illness Duane Collins is a 62 year old male with ulcerative colitis who presents for follow-up of his condition.  He has a long-standing history of ulcerative colitis, diagnosed around 2006, primarily affecting the rectum and sigmoid colon. His symptoms have remained stable without significant progression.  He uses Rowasa  enemas as a treatment, initially finding them difficult to retain, but now reports improvement in retention. He administers the enemas nightly and recently picked up a new prescription to ensure continuity. His symptoms have resolved.  No diarrhea or constipation.  No further hematochezia.  In his past medical history, he has used steroid enemas previously. He has completed a TB test and hepatitis screening in preparation for potential future treatments. Recent blood work showed a hemoglobin level of 12.3, an improvement from previous results, and normal C-reactive protein levels.  He has received his first shingles vaccine and plans to receive the second dose in May, with no adverse effects from the first dose.  He wants to hold off on Humira/Biologics.   Past GI workup  Colonoscopy 12/31/2023 - Severe ( Mayo Score 3) proctosigmoiditis ( ulcerative) colitis. Biopsied. - Very mild sigmoid diverticulosis. - The examined remaining colon and TI was normal. -Bx: Mildly active chronic colitis.  Remaining colonic biopsies were negative for colitis. 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  Diagnosed on colonoscopy 04/2005.   Has been on Rowasa  enemas.  He would stop it when he gets better.  Past Medical History:  Diagnosis Date   Anemia    Anxiety    Arthritis knee   Asthma    no inhaler, only when over exerting self   Diabetes mellitus without complication (HCC)    Type II   High cholesterol    Hypertension    Tibia/fibula fracture, shaft, right, open type I segmental 07/19/2018   Vitamin D  deficiency     Past Surgical History:  Procedure Laterality Date   APPENDECTOMY     COLONOSCOPY  12/10/2014   Very Minimal erythema up to 20cm consistent with previous history of left-sided colitis (minimal endoscopic activity-biopsied). Otherwise normal colonoscopy to terminal ileum.   ESOPHAGOGASTRODUODENOSCOPY  04/09/2005   Normal EGD   HARDWARE REMOVAL Right 03/28/2019   Procedure: HARDWARE REMOVAL RIGHT TIBIA;  Surgeon: Hardy Lia, MD;  Location: [facility] OR;  Service: Orthopedics;  Laterality: Right;   HARDWARE REMOVAL Right 10/16/2019   Procedure: HARDWARE REMOVAL RIGHT TIBIA;  Surgeon: Hardy Lia, MD;  Location: [facility] OR;  Service: Orthopedics;  Laterality: Right;   HARDWARE REMOVAL Right 12/17/2020   Procedure: HARDWARE REMOVAL TIBIA;  Surgeon: Hardy Lia, MD;  Location: [facility] OR;  Service: Orthopedics;  Laterality: Right;   IM NAILING TIBIA Right 03/28/2019   KNEE ARTHROSCOPY Right    Menisus tear   KNEE ARTHROSCOPY Left    "cleaned"   LAPAROSCOPIC APPENDECTOMY     TIBIA IM NAIL INSERTION Right 07/19/2018   Procedure: INTRAMEDULLARY (IM) NAIL TIBIAL;  Surgeon: Osa Blase, MD;  Location:  [facility] OR;  Service: Orthopedics;  Laterality: Right;   TIBIA IM NAIL INSERTION Right 03/28/2019   Procedure: INTRAMEDULLARY (IM) NAIL RIGHT TIBIAL;  Surgeon: Hardy Lia, MD;  Location: [facility] OR;  Service: Orthopedics;  Laterality: Right;   TIBIA IM NAIL INSERTION Right 10/16/2019   Procedure: RIGHT INTRAMEDULLARY TIBIAL NAIL EXCHANGE;  Surgeon: Hardy Lia, MD;  Location: [facility] OR;  Service: Orthopedics;   Laterality: Right;    Family History  Problem Relation Age of Onset   Colon cancer Neg Hx    Colon polyps Neg Hx    Esophageal cancer Neg Hx    Rectal cancer Neg Hx    Stomach cancer Neg Hx    Pancreatic cancer Neg Hx     Social History   Tobacco Use   Smoking status: Former    Current packs/day: 0.00    Types: Cigarettes    Start date: 1995    Quit date: 2000    Years since quitting: 25.3   Smokeless tobacco: Never  Vaping Use   Vaping status: Never Used  Substance Use Topics   Alcohol use: Not Currently    Comment: quit 25 yrs. ago   Drug use: Never    Current Outpatient Medications  Medication Sig Dispense Refill   albuterol (VENTOLIN HFA) 108 (90 Base) MCG/ACT inhaler Inhale 1-2 puffs into the lungs every 6 (six) hours as needed for wheezing or shortness of breath.     ascorbic acid  (C 500/ROSE HIPS) 500 MG tablet Take 1 tablet by mouth daily.     aspirin  EC 81 MG tablet Take 81 mg by mouth daily.     citalopram  (CELEXA ) 40 MG tablet Take 40 mg by mouth daily.     Cyanocobalamin (B-12 PO) Take 1 tablet by mouth daily.     ferrous sulfate  325 (65 FE) MG tablet Take 325 mg by mouth every Monday.  1   glipiZIDE (GLUCOTROL XL) 2.5 MG 24 hr tablet Take 2.5 mg by mouth daily.     losartan (COZAAR) 100 MG tablet Take 1 tablet by mouth daily.     mesalamine  (ROWASA ) 4 g enema Place 60 mLs (4 g total) rectally daily. 4 gram Enema daily per Rectum 1800 mL 6   metFORMIN  (GLUCOPHAGE ) 1000 MG tablet Take 1,000 mg by mouth 2 (two) times daily with a meal.  2   metoprolol tartrate (LOPRESSOR) 25 MG tablet Take 25 mg by mouth 2 (two) times daily.     nitroGLYCERIN (NITROSTAT) 0.4 MG SL tablet Place 0.4 mg under the tongue every 5 (five) minutes as needed.     OZEMPIC, 0.25 OR 0.5 MG/DOSE, 2 MG/3ML SOPN Inject 2.5 mg as directed once a week.     rosuvastatin (CRESTOR) 40 MG tablet Take 1 tablet by mouth daily.     Zinc Picolinate POWD Take 50 mg by mouth daily.     No current  facility-administered medications for this visit.    Allergies  Allergen Reactions   Oxycodone  Anxiety and Palpitations    Angioedema    Review of Systems:  neg     Physical Exam:    BP 112/62   Pulse (!) 52   Ht 5\' 9"  (1.753 m)   Wt 198 lb (89.8 kg)   SpO2 96%   BMI 29.24 kg/m  Wt Readings from Last 3 Encounters:  02/22/24 198 lb (89.8 kg)  12/31/23 200 lb (90.7 kg)  12/13/23 200 lb (90.7 kg)   Constitutional:  Well-developed, in no acute distress.  Psychiatric: Normal mood and affect. Behavior is normal. HEENT: Pupils normal.  Conjunctivae are normal. No scleral icterus. Cardiovascular: Normal rate, regular rhythm. No edema Pulmonary/chest: Effort normal and breath sounds normal. No wheezing, rales or rhonchi. Abdominal: Soft, nondistended. Nontender. Bowel sounds active throughout. There are no masses palpable. No hepatomegaly. Rectal: Deferred Neurological: Alert and oriented to person place and time. Skin: Skin is warm and dry. No rashes noted.  Data Reviewed: I have personally reviewed following labs and imaging studies  CBC:    Latest Ref Rng & Units 12/31/2023    4:13 PM 12/17/2020    6:07 AM 10/17/2019    2:57 AM  CBC  WBC 4.0 - 10.5 K/uL 5.4  5.6  9.3   Hemoglobin 13.0 - 17.0 g/dL 95.2  84.1  32.4   Hematocrit 39.0 - 52.0 % 37.0  39.8  34.4   Platelets 150.0 - 400.0 K/uL 372.0  272  297     CMP:    Latest Ref Rng & Units 12/31/2023    4:13 PM 12/17/2020    6:07 AM 10/12/2019   11:23 AM  CMP  Glucose 70 - 99 mg/dL 401  027  253   BUN 6 - 23 mg/dL 7  11  10    Creatinine 0.40 - 1.50 mg/dL 6.64  4.03  4.74   Sodium 135 - 145 mEq/L 142  138  140   Potassium 3.5 - 5.1 mEq/L 3.8  3.9  4.1   Chloride 96 - 112 mEq/L 106  103  103   CO2 19 - 32 mEq/L 29  27  28    Calcium  8.4 - 10.5 mg/dL 9.1  9.2  9.1   Total Protein 6.0 - 8.3 g/dL 7.5     Total Bilirubin 0.2 - 1.2 mg/dL 0.4     Alkaline Phos 39 - 117 U/L 61     AST 0 - 37 U/L 13     ALT 0 - 53 U/L  10        Magnus Schuller, MD 02/22/2024, 1:32 PM  Cc: No ref. provider found

## 2024-02-22 NOTE — Patient Instructions (Signed)
 _______________________________________________________  If your blood pressure at your visit was 140/90 or greater, please contact your primary care physician to follow up on this.  If you are age 61 or younger, your body mass index should be between 19-25. Your Body mass index is 29.24 kg/m. If this is out of the aformentioned range listed, please consider follow up with your Primary Care Provider.   We have sent the following medications to your pharmacy for you to pick up at your convenience: Rowasa  ________________________________________________________  The [facility] providers would like to encourage you to use MYCHART to communicate with providers for non-urgent requests or questions.  Due to long hold times on the telephone, sending your provider a message by [facility] may be a faster and more efficient way to get a response.  Please allow 48 business hours for a response.  Please remember that this is for non-urgent requests.  _______________________________________________________  It was a pleasure to see you today!  Lajuan Pila, MD

## 2024-02-23 ENCOUNTER — Telehealth: Payer: Self-pay

## 2024-02-23 ENCOUNTER — Other Ambulatory Visit (HOSPITAL_COMMUNITY): Payer: Self-pay

## 2024-02-23 NOTE — Telephone Encounter (Signed)
 Pharmacy Patient Advocate Encounter   Received notification from CoverMyMeds that prior authorization for Mesalamine  4GM enemas is required/requested.   Insurance verification completed.   The patient is insured through  [employer]  .   Per test claim: Refill too soon. PA is not needed at this time. Medication was filled xxx. Next eligible fill date is 04.23.2025.

## 2024-08-11 NOTE — Progress Notes (Signed)
 08/14/2024 DRAVON NOTT [id number] Dec 23, 1960  Referring provider: Hunter Mickey Browner, PA-C Primary GI doctor: Dr. Charlanne  ASSESSMENT AND PLAN:   Ulcerative proctosigmoiditis Colonoscopy 12/31/2023 Mayo score 3 proctosigmoiditis mildly active chronic colitis On Rowasa  enemas for 1-2 months with resolution of symptoms ( diarrhea, fecal incontinence, rectal burning)  Symptoms resolved. No current signs of active inflammation. Previous inflammatory markers negative. - Check inflammatory markers including sed rate and CRP. - Order fecal calprotectin - continue enemas as needed for symptoms - Follow up in 6 months sooner if inflammatory markers are elevated.  IDA 12/31/2023  HGB 12.3 MCV 99.6 Platelets 372.0 Previous iron and ferritin low in 2020 Takes iron daily, no longer on a B12 Recent Labs    12/31/23 1613  HGB 12.3*   Type 2 diabetes On Ozempic  Vitamin D  deficiency Patient Care Team: Nodal, Mickey Browner, PA-C as PCP - General (Physician Assistant)  HISTORY OF PRESENT ILLNESS: 63 y.o. male presents for evaluation of ulcerative proctosigmoiditis up to 25 cm. Last seen in the office on 02/22/2024 by Dr. Charlanne.   IBD history: Diagnosed 2006 primarily affecting rectum and sigmoid colon, presenting with hematochezia. Rowasa  enemas nightly  Last colonoscopy: 12/31/2023 colonoscopy showed severe Mayo score 3 proctosigmoiditis very mild sigmoid diverticulosis remaining colon TI normal biopsy mildly active chronic colitis Last small bowel imaging: Never Extraintestinal manifestations: The patient has not had any extraintestinal symptoms Surgical history: no surgery.  Other significant medical history:  Current History Discussed the use of AI scribe software for clinical note transcription with the patient, who gave verbal consent to proceed.  History of Present Illness   AZELL BILL is a 63 year old male with proctosigmoiditis who presents for follow-up of his  condition.  He was diagnosed with proctosigmoiditis following a colonoscopy in February 2025. Initially, he experienced symptoms such as diarrhea, incontinence, and a burning sensation in the rectal area. These symptoms have since resolved, and he has not required the use of enemas recently, which were prescribed for as-needed use after his diagnosis.  No current symptoms of rectal burning, diarrhea, or incontinence. No fevers, chills, abdominal pain, or unintentional weight loss. He is actively trying to lose weight. No rashes, joint pain, changes in vision, rectal bleeding, nausea, vomiting, heartburn, or trouble swallowing.  He has bowel movements every other day, which can be loose depending on his diet. He is taking Ozempic and takes iron pills daily since 2020 due to previously low iron and ferritin levels.  He received a shingles vaccine but has not had a flu shot since a negative experience with it in the past.       Recent labs: 12/31/2023 WBC 5.4 HGB 12.3 MCV 99.6 Platelets 372.0 12/31/2023 AST 13 ALT 10 Alkphos 61 TBili 0.4 TB GOLD 12/31/2023 NEGATIVE or TB skin if indeterminate.  HepBsAG 12/31/2023 NON-REACTIVE  12/31/2023 TB GOLD NEGATIVE 12/31/2023  HepBsAG NON-REACTIVE      Latest Ref Rng & Units 12/31/2023   16:13 10/16/2019   06:26  Inflammatory Markers  Sed Rate 0 - 16 mm/hr  14       Performed at [facility], [address], [place], [place] [address]  CRP 0.5 - 20.0 mg/dL <8.9  0.6       Performed at [facility], [address], [place], [place] [address]   IBD Health Care Maintenance: Annual Flu Vaccine - Suggest getting, patient declines Pneumococcal Vaccine if receiving immunosuppression: -   TB  testing if on anti-TNF, yearly - UTD Vitamin D  screening - check COVID vaccine - completed Shingrix - completed  Immunization History  Administered Date(s) Administered   PFIZER(Purple Top)SARS-COV-2 Vaccination 02/26/2020, 03/19/2020, 10/22/2020   Tdap  07/19/2018    RELEVANT GI HISTORY, LABS, IMAGING:  CBC    Component Value Date/Time   WBC 5.4 12/31/2023 1613   RBC 3.72 (L) 12/31/2023 1613   HGB 12.3 (L) 12/31/2023 1613   HCT 37.0 (L) 12/31/2023 1613   PLT 372.0 12/31/2023 1613   MCV 99.6 12/31/2023 1613   MCH 31.0 12/17/2020 0607   MCHC 33.1 12/31/2023 1613   RDW 15.3 12/31/2023 1613   LYMPHSABS 2.7 03/28/2019 0628   MONOABS 0.6 03/28/2019 0628   EOSABS 0.1 03/28/2019 0628   BASOSABS 0.0 03/28/2019 0628   Recent Labs    12/31/23 1613  HGB 12.3*    CMP     Component Value Date/Time   NA 142 12/31/2023 1613   K 3.8 12/31/2023 1613   CL 106 12/31/2023 1613   CO2 29 12/31/2023 1613   GLUCOSE 123 (H) 12/31/2023 1613   BUN 7 12/31/2023 1613   CREATININE 1.04 12/31/2023 1613   CALCIUM  9.1 12/31/2023 1613   CALCIUM  8.5 (L) 03/30/2019 0421   PROT 7.5 12/31/2023 1613   ALBUMIN 4.2 12/31/2023 1613   AST 13 12/31/2023 1613   ALT 10 12/31/2023 1613   ALKPHOS 61 12/31/2023 1613   BILITOT 0.4 12/31/2023 1613   GFRNONAA >60 12/17/2020 0607   GFRAA >60 10/12/2019 1123      Latest Ref Rng & Units 12/31/2023    4:13 PM 03/28/2019    6:28 AM 07/19/2018    9:01 AM  Hepatic Function  Total Protein 6.0 - 8.3 g/dL 7.5  6.7  5.8   Albumin 3.5 - 5.2 g/dL 4.2  3.8  3.2   AST 0 - 37 U/L 13  18  21    ALT 0 - 53 U/L 10  16  24    Alk Phosphatase 39 - 117 U/L 61  105  41   Total Bilirubin 0.2 - 1.2 mg/dL 0.4  0.5  0.5       Current Medications:   Current Outpatient Medications (Endocrine & Metabolic):    glipiZIDE (GLUCOTROL XL) 2.5 MG 24 hr tablet, Take 2.5 mg by mouth daily.   metFORMIN  (GLUCOPHAGE ) 1000 MG tablet, Take 1,000 mg by mouth 2 (two) times daily with a meal.   OZEMPIC, 0.25 OR 0.5 MG/DOSE, 2 MG/3ML SOPN, Inject 2.5 mg as directed once a week.  Current Outpatient Medications (Cardiovascular):    amLODipine  (NORVASC ) 10 MG tablet, Take 10 mg by mouth daily.   isosorbide mononitrate (IMDUR) 30 MG 24 hr tablet,  Take 30 mg by mouth daily.   losartan (COZAAR) 100 MG tablet, Take 1 tablet by mouth daily.   metoprolol tartrate (LOPRESSOR) 25 MG tablet, Take 25 mg by mouth 2 (two) times daily.   nitroGLYCERIN (NITROSTAT) 0.4 MG SL tablet, Place 0.4 mg under the tongue every 5 (five) minutes as needed.   rosuvastatin (CRESTOR) 40 MG tablet, Take 1 tablet by mouth daily.  Current Outpatient Medications (Respiratory):    albuterol (VENTOLIN HFA) 108 (90 Base) MCG/ACT inhaler, Inhale 1-2 puffs into the lungs every 6 (six) hours as needed for wheezing or shortness of breath.  Current Outpatient Medications (Analgesics):    aspirin  EC 81 MG tablet, Take 81 mg by mouth daily.   diclofenac  (VOLTAREN ) 75 MG EC tablet, Take 75  mg by mouth 2 (two) times daily.  Current Outpatient Medications (Hematological):    Cyanocobalamin (B-12 PO), Take 1 tablet by mouth daily.   ferrous sulfate  325 (65 FE) MG tablet, Take 325 mg by mouth every Monday.  Current Outpatient Medications (Other):    ascorbic acid  (C 500/ROSE HIPS) 500 MG tablet, Take 1 tablet by mouth daily.   mesalamine  (ROWASA ) 4 g enema, Place 60 mLs (4 g total) rectally daily. 4 gram Enema daily per Rectum   tamsulosin (FLOMAX) 0.4 MG CAPS capsule, Take 0.4 mg by mouth daily.   Zinc Picolinate POWD, Take 50 mg by mouth daily.  Medical History:  Past Medical History:  Diagnosis Date   Anemia    Anxiety    Arthritis knee   Asthma    no inhaler, only when over exerting self   Diabetes mellitus without complication (HCC)    Type II   High cholesterol    Hypertension    Tibia/fibula fracture, shaft, right, open type I segmental 07/19/2018   Vitamin D  deficiency    Allergies:  Allergies  Allergen Reactions   Oxycodone  Anxiety and Palpitations    Angioedema     Surgical History:  He  has a past surgical history that includes Tibia IM nail insertion (Right, 07/19/2018); Appendectomy; Knee arthroscopy (Right); Knee arthroscopy (Left); IM nailing  tibia (Right, 03/28/2019); Tibia IM nail insertion (Right, 03/28/2019); Hardware Removal (Right, 03/28/2019); Hardware Removal (Right, 10/16/2019); Tibia IM nail insertion (Right, 10/16/2019); Hardware Removal (Right, 12/17/2020); Colonoscopy (12/10/2014); Esophagogastroduodenoscopy (04/09/2005); and laparoscopic appendectomy. Family History:  His family history is not on file.  REVIEW OF SYSTEMS  : All other systems reviewed and negative except where noted in the History of Present Illness.  PHYSICAL EXAM: BP (!) 140/82   Pulse 63   Ht 5' 9 (1.753 m)   Wt 196 lb (88.9 kg)   BMI 28.94 kg/m  Physical Exam   GENERAL APPEARANCE: Well nourished, in no apparent distress. HEENT: No cervical lymphadenopathy, unremarkable thyroid , sclerae anicteric, conjunctiva pink. RESPIRATORY: Respiratory effort normal, breath sounds equal bilaterally without rales, rhonchi, or wheezing. CARDIO: Regular rate and rhythm with no murmurs, rubs, or gallops, peripheral pulses intact. ABDOMEN: Soft, non-distended, active bowel sounds in all four quadrants, no tenderness to palpation, no rebound, no mass appreciated. RECTAL: Declines. MUSCULOSKELETAL: Full range of motion, normal gait, without edema. SKIN: Dry, intact without rashes or lesions. No jaundice. NEURO: Alert, oriented, no focal deficits. PSYCH: Cooperative, normal mood and affect.        Alan JONELLE Coombs, PA-C 12:07 PM

## 2024-08-14 ENCOUNTER — Other Ambulatory Visit (INDEPENDENT_AMBULATORY_CARE_PROVIDER_SITE_OTHER)

## 2024-08-14 ENCOUNTER — Ambulatory Visit: Admitting: Physician Assistant

## 2024-08-14 ENCOUNTER — Ambulatory Visit: Payer: Self-pay | Admitting: Physician Assistant

## 2024-08-14 ENCOUNTER — Encounter: Payer: Self-pay | Admitting: Physician Assistant

## 2024-08-14 VITALS — BP 140/82 | HR 63 | Ht 69.0 in | Wt 196.0 lb

## 2024-08-14 DIAGNOSIS — K5130 Ulcerative (chronic) rectosigmoiditis without complications: Secondary | ICD-10-CM

## 2024-08-14 DIAGNOSIS — Z7985 Long-term (current) use of injectable non-insulin antidiabetic drugs: Secondary | ICD-10-CM | POA: Diagnosis not present

## 2024-08-14 DIAGNOSIS — D509 Iron deficiency anemia, unspecified: Secondary | ICD-10-CM

## 2024-08-14 DIAGNOSIS — K51319 Ulcerative (chronic) rectosigmoiditis with unspecified complications: Secondary | ICD-10-CM

## 2024-08-14 DIAGNOSIS — E119 Type 2 diabetes mellitus without complications: Secondary | ICD-10-CM

## 2024-08-14 LAB — CBC WITH DIFFERENTIAL/PLATELET
Basophils Absolute: 0 K/uL (ref 0.0–0.1)
Basophils Relative: 0.6 % (ref 0.0–3.0)
Eosinophils Absolute: 0 K/uL (ref 0.0–0.7)
Eosinophils Relative: 0.7 % (ref 0.0–5.0)
HCT: 37.1 % — ABNORMAL LOW (ref 39.0–52.0)
Hemoglobin: 12.1 g/dL — ABNORMAL LOW (ref 13.0–17.0)
Lymphocytes Relative: 45.2 % (ref 12.0–46.0)
Lymphs Abs: 2.1 K/uL (ref 0.7–4.0)
MCHC: 32.6 g/dL (ref 30.0–36.0)
MCV: 95.5 fl (ref 78.0–100.0)
Monocytes Absolute: 0.6 K/uL (ref 0.1–1.0)
Monocytes Relative: 12.1 % — ABNORMAL HIGH (ref 3.0–12.0)
Neutro Abs: 1.9 K/uL (ref 1.4–7.7)
Neutrophils Relative %: 41.4 % — ABNORMAL LOW (ref 43.0–77.0)
Platelets: 268 K/uL (ref 150.0–400.0)
RBC: 3.89 Mil/uL — ABNORMAL LOW (ref 4.22–5.81)
RDW: 16.1 % — ABNORMAL HIGH (ref 11.5–15.5)
WBC: 4.6 K/uL (ref 4.0–10.5)

## 2024-08-14 LAB — SEDIMENTATION RATE: Sed Rate: 20 mm/h (ref 0–20)

## 2024-08-14 LAB — BASIC METABOLIC PANEL WITH GFR
BUN: 12 mg/dL (ref 6–23)
CO2: 28 meq/L (ref 19–32)
Calcium: 9.2 mg/dL (ref 8.4–10.5)
Chloride: 104 meq/L (ref 96–112)
Creatinine, Ser: 1.03 mg/dL (ref 0.40–1.50)
GFR: 78.71 mL/min (ref >60.00)
Glucose, Bld: 78 mg/dL (ref 70–99)
Potassium: 4.4 meq/L (ref 3.5–5.1)
Sodium: 140 meq/L (ref 135–145)

## 2024-08-14 LAB — HEPATIC FUNCTION PANEL
ALT: 9 U/L (ref 0–53)
AST: 14 U/L (ref 0–37)
Albumin: 4.6 g/dL (ref 3.5–5.2)
Alkaline Phosphatase: 47 U/L (ref 39–117)
Bilirubin, Direct: 0.1 mg/dL (ref 0.0–0.3)
Total Bilirubin: 0.4 mg/dL (ref 0.2–1.2)
Total Protein: 7.4 g/dL (ref 6.0–8.3)

## 2024-08-14 LAB — HIGH SENSITIVITY CRP: CRP, High Sensitivity: 0.21 mg/L (ref 0.000–5.000)

## 2024-08-14 NOTE — Patient Instructions (Addendum)
 Your provider has requested that you go to the basement level for lab work before leaving today. Press B on the elevator. The lab is located at the first door on the left as you exit the elevator.   Understanding Your Weekly GLP-1 Injection  A helpful guide to managing common side effects  You are on a once-weekly injectable medication in the GLP-1 receptor agonist class. These medications can be very effective for blood sugar control, weight loss, and heart protection, fatty liver or OSA, but they can also come with some side effects that are important to understand. The good news is: most side effects can be managed with a few adjustments.  1. Gastroparesis-Like Symptoms These medications slow down your stomach to help you feel full longer -- great for weight loss and blood sugar control, but they can sometimes cause symptoms that feel like gastroparesis (slow stomach emptying). Symptoms may include: -Feeling full quickly when eating -Nausea or vomiting -Bloating or abdominal discomfort -Worsening heartburn or reflux -Acid regurgitation -Stomach spasms or tightness What you can do: ??? Eat small, frequent meals (4-6 per day) ?? Drink fluids between meals, not during ?? Avoid high-fiber foods (like raw veggies or whole grains); cook your veggies well ?? Spread protein throughout the day (try [place] yogurt, eggs, soft meats, Glucerna, milk) ?? Choose soft foods you can mash with a fork ?? Switch to pured foods or liquids during flare-ups ?? Consider reading: Living Well with Gastroparesis by Camelia Bone ?? Downloadable Diet Guide: [facility] Gastroparesis Diet PDF  ?? Tip: Try following a gastroparesis-friendly diet on the day of your injection and the day after, when the medication's effect is strongest.  2. Constipation Since this medication slows down your digestive system, constipation is very common. Tips to help: ?? Drink plenty of water ???? Stay active with  regular exercise ?? Add fiber-rich but gentle foods like kiwi ?? Try a low-dose magnesium  supplement at night ?? Use MiraLAX  (half to one capful daily) if constipation becomes more frequent, especially if your dose increases  If these strategies don't help, talk to your provider -- they may recommend or prescribe other treatments.  3. When to Call the Doctor or Go to the ER While rare, this medication can slightly increase your risk of serious conditions like: Pancreatitis (inflammation of the pancreas) Gallstones or gallbladder problems Watch for these signs and seek help if you experience: Severe abdominal pain (especially in the upper belly or that radiates to your back) Pain in the right upper side of your abdomen Nausea, vomiting, fever, or chills that don't go away ?? Call your provider or go to the ER if these occur.  4. Who Should NOT Take This Medication? This medication should be avoided if you have: A personal history of pancreatitis  A personal or family history of medullary thyroid  cancer A condition called Multiple Endocrine Neoplasia Syndrome Type 2 (MEN2)  Final Note If the side effects are too bothersome, remember: Most symptoms will go away if you stop the medication. But many people tolerate it well after the first few weeks, especially with the right strategies in place.  Due to recent changes in healthcare laws, you may see the results of your imaging and laboratory studies on MyChart before your provider has had a chance to review them.  We understand that in some cases there may be results that are confusing or concerning to you. Not all laboratory results come back in the same time frame and the provider  may be waiting for multiple results in order to interpret others.  Please give us  48 hours in order for your provider to thoroughly review all the results before contacting the office for clarification of your results.    I appreciate the  opportunity to care for  you  Thank You   [facility]

## 2024-12-05 ENCOUNTER — Other Ambulatory Visit: Payer: Self-pay | Admitting: Gastroenterology

## 2024-12-07 ENCOUNTER — Other Ambulatory Visit (HOSPITAL_COMMUNITY): Payer: Self-pay

## 2024-12-07 ENCOUNTER — Other Ambulatory Visit: Payer: Self-pay

## 2024-12-07 MED ORDER — MESALAMINE 4 G RE ENEM: 4.0000 g | ENEMA | Freq: Every day | RECTAL | 6 refills | Status: AC

## 2024-12-07 NOTE — Telephone Encounter (Signed)
 No PA required. Co-pay is $5.10
